# Patient Record
Sex: Female | Born: 1965 | Race: Black or African American | Hispanic: No | Marital: Single | Smoking: Former smoker
Health system: Southern US, Community
[De-identification: ages and names within clinical notes are randomized; demographics above are authoritative.]

## PROBLEM LIST (undated history)

## (undated) DIAGNOSIS — R569 Unspecified convulsions: Secondary | ICD-10-CM

## (undated) DIAGNOSIS — C801 Malignant (primary) neoplasm, unspecified: Secondary | ICD-10-CM

## (undated) DIAGNOSIS — D571 Sickle-cell disease without crisis: Secondary | ICD-10-CM

---

## 1998-12-18 ENCOUNTER — Emergency Department (HOSPITAL_COMMUNITY): Admission: EM | Admit: 1998-12-18 | Discharge: 1998-12-18 | Payer: Self-pay | Admitting: Emergency Medicine

## 2000-08-14 ENCOUNTER — Encounter: Payer: Self-pay | Admitting: Emergency Medicine

## 2000-08-14 ENCOUNTER — Emergency Department (HOSPITAL_COMMUNITY): Admission: EM | Admit: 2000-08-14 | Discharge: 2000-08-14 | Payer: Self-pay | Admitting: *Deleted

## 2001-01-29 ENCOUNTER — Emergency Department (HOSPITAL_COMMUNITY): Admission: EM | Admit: 2001-01-29 | Discharge: 2001-01-30 | Payer: Self-pay | Admitting: Emergency Medicine

## 2001-01-29 ENCOUNTER — Encounter: Payer: Self-pay | Admitting: Emergency Medicine

## 2001-11-13 ENCOUNTER — Encounter: Payer: Self-pay | Admitting: Emergency Medicine

## 2001-11-13 ENCOUNTER — Inpatient Hospital Stay (HOSPITAL_COMMUNITY): Admission: AC | Admit: 2001-11-13 | Discharge: 2001-11-14 | Payer: Self-pay

## 2004-04-04 ENCOUNTER — Emergency Department (HOSPITAL_COMMUNITY): Admission: EM | Admit: 2004-04-04 | Discharge: 2004-04-04 | Payer: Self-pay | Admitting: Emergency Medicine

## 2004-05-12 ENCOUNTER — Emergency Department (HOSPITAL_COMMUNITY): Admission: EM | Admit: 2004-05-12 | Discharge: 2004-05-12 | Payer: Self-pay | Admitting: Emergency Medicine

## 2006-10-18 ENCOUNTER — Inpatient Hospital Stay (HOSPITAL_COMMUNITY): Admission: AD | Admit: 2006-10-18 | Discharge: 2006-10-18 | Payer: Self-pay | Admitting: Obstetrics and Gynecology

## 2006-11-17 ENCOUNTER — Inpatient Hospital Stay (HOSPITAL_COMMUNITY): Admission: AD | Admit: 2006-11-17 | Discharge: 2006-11-17 | Payer: Self-pay | Admitting: Obstetrics and Gynecology

## 2006-12-04 ENCOUNTER — Inpatient Hospital Stay (HOSPITAL_COMMUNITY): Admission: AD | Admit: 2006-12-04 | Discharge: 2006-12-04 | Payer: Self-pay | Admitting: Obstetrics & Gynecology

## 2006-12-04 ENCOUNTER — Ambulatory Visit (HOSPITAL_COMMUNITY): Admission: RE | Admit: 2006-12-04 | Discharge: 2006-12-04 | Payer: Self-pay | Admitting: Family Medicine

## 2008-11-01 ENCOUNTER — Emergency Department (HOSPITAL_COMMUNITY): Admission: EM | Admit: 2008-11-01 | Discharge: 2008-11-01 | Payer: Self-pay | Admitting: Emergency Medicine

## 2009-01-24 ENCOUNTER — Emergency Department (HOSPITAL_COMMUNITY): Admission: EM | Admit: 2009-01-24 | Discharge: 2009-01-24 | Payer: Self-pay | Admitting: Family Medicine

## 2009-03-14 ENCOUNTER — Ambulatory Visit: Payer: Self-pay | Admitting: Internal Medicine

## 2009-03-26 ENCOUNTER — Ambulatory Visit: Payer: Self-pay | Admitting: *Deleted

## 2009-05-03 ENCOUNTER — Ambulatory Visit: Payer: Self-pay | Admitting: Internal Medicine

## 2009-05-07 ENCOUNTER — Ambulatory Visit: Payer: Self-pay | Admitting: Internal Medicine

## 2009-05-14 ENCOUNTER — Encounter: Payer: Self-pay | Admitting: Family Medicine

## 2009-05-14 ENCOUNTER — Ambulatory Visit: Payer: Self-pay | Admitting: Internal Medicine

## 2009-05-14 LAB — CONVERTED CEMR LAB
Chlamydia, DNA Probe: NEGATIVE
GC Probe Amp, Genital: NEGATIVE

## 2009-05-15 ENCOUNTER — Encounter: Admission: RE | Admit: 2009-05-15 | Discharge: 2009-05-15 | Payer: Self-pay | Admitting: Family Medicine

## 2009-05-23 ENCOUNTER — Encounter (HOSPITAL_COMMUNITY): Admission: RE | Admit: 2009-05-23 | Discharge: 2009-08-10 | Payer: Self-pay | Admitting: Family Medicine

## 2009-08-02 ENCOUNTER — Ambulatory Visit: Payer: Self-pay | Admitting: Internal Medicine

## 2009-10-10 ENCOUNTER — Encounter (INDEPENDENT_AMBULATORY_CARE_PROVIDER_SITE_OTHER): Payer: Self-pay | Admitting: Family Medicine

## 2009-10-10 ENCOUNTER — Ambulatory Visit: Payer: Self-pay | Admitting: Internal Medicine

## 2009-10-10 DIAGNOSIS — E079 Disorder of thyroid, unspecified: Secondary | ICD-10-CM | POA: Insufficient documentation

## 2009-10-10 DIAGNOSIS — K5909 Other constipation: Secondary | ICD-10-CM

## 2009-10-10 DIAGNOSIS — N643 Galactorrhea not associated with childbirth: Secondary | ICD-10-CM

## 2009-10-10 LAB — CONVERTED CEMR LAB
Free T4: 1.51 ng/dL (ref 0.80–1.80)
T3, Total: 189 ng/dL (ref 80.0–204.0)

## 2010-08-29 ENCOUNTER — Inpatient Hospital Stay (HOSPITAL_COMMUNITY)
Admission: EM | Admit: 2010-08-29 | Discharge: 2010-09-02 | Payer: Self-pay | Source: Home / Self Care | Admitting: Emergency Medicine

## 2010-10-27 ENCOUNTER — Encounter: Payer: Self-pay | Admitting: Family Medicine

## 2010-10-27 ENCOUNTER — Encounter: Payer: Self-pay | Admitting: Internal Medicine

## 2010-10-27 ENCOUNTER — Encounter: Payer: Self-pay | Admitting: General Surgery

## 2010-10-27 ENCOUNTER — Encounter: Payer: Self-pay | Admitting: *Deleted

## 2010-10-28 ENCOUNTER — Encounter: Payer: Self-pay | Admitting: Family Medicine

## 2010-11-05 NOTE — Miscellaneous (Signed)
Summary: Office Visit ([facility])    Visit Type:  Acute visit   History of Present Illness: Constipation. Pt had been taking Miralax, however has been out for some time. Previously while taking, she has no idea if it was helping her. Not taking anything right now. BMs hard, painful with BRB when wiping and a little in the toilet.  L breast with white discharge and smelly since she had her first child in 1985. LMP finished yesterday. Recently went and had mammogram completed. 7mm cyst in right breast. No discharge with test 05/2009.   Review of Systems General:  Complains of chills and fever; 1st time, 1st episode last week.  None since. subjective. No rigors.Marland Kitchen Resp:  Denies chest discomfort, cough, coughing up blood, shortness of breath, and wheezing. GU:  Denies dysuria.   Physical Exam  General:  Well-developed,well-nourished,in no acute distress; alert,appropriate and cooperative throughout examination Head:  Normocephalic and atraumatic without obvious abnormalities. No apparent alopecia or balding. Neck:  + diffuse goiter, no nodules Lungs:  Normal respiratory effort, chest expands symmetrically. Lungs are clear to auscultation, no crackles or wheezes. Heart:  Normal rate and regular rhythm. S1 and S2 normal without gallop, murmur, click, rub or other extra sounds. Additional Exam:  Breast exam. no discharge on L, no masses on L, sebbaceous cyst between breasts.   Impression & Recommendations:  Problem # 1:  CONSTIPATION, CHRONIC (ICD-564.09) Assessment Unchanged Pt failed to get her refills on Miralax.  Miralax Rx'd.  Problem # 2:  GALACTORRHEA (ICD-611.6) Assessment: Unchanged L breast, non bloody Recent Mammo with only simple cyst in right breast Prolactin normal TSH low  Problem # 3:  THYROID STIMULATING HORMONE, ABNORMAL (ICD-246.9) Repeat TSH, FT4, FT3 Attempt to reorder re-uptake scan

## 2010-12-17 LAB — BASIC METABOLIC PANEL
BUN: 1 mg/dL — ABNORMAL LOW (ref 6–23)
Calcium: 8.2 mg/dL — ABNORMAL LOW (ref 8.4–10.5)
Chloride: 106 mEq/L (ref 96–112)
Creatinine, Ser: 0.53 mg/dL (ref 0.4–1.2)
GFR calc Af Amer: >60 mL/min (ref >60)
GFR calc non Af Amer: >60 mL/min (ref >60)

## 2010-12-17 LAB — COMPREHENSIVE METABOLIC PANEL
ALT: 13 U/L (ref 0–35)
ALT: 15 U/L (ref 0–35)
AST: 18 U/L (ref 0–37)
AST: 21 U/L (ref 0–37)
Albumin: 2.9 g/dL — ABNORMAL LOW (ref 3.5–5.2)
Alkaline Phosphatase: 87 U/L (ref 39–117)
BUN: 15 mg/dL (ref 6–23)
CO2: 23 mEq/L (ref 19–32)
CO2: 25 mEq/L (ref 19–32)
CO2: 26 mEq/L (ref 19–32)
Calcium: 8.7 mg/dL (ref 8.4–10.5)
Chloride: 106 mEq/L (ref 96–112)
Chloride: 106 mEq/L (ref 96–112)
Chloride: 107 mEq/L (ref 96–112)
Creatinine, Ser: 0.53 mg/dL (ref 0.4–1.2)
Creatinine, Ser: 0.76 mg/dL (ref 0.4–1.2)
GFR calc Af Amer: >60 mL/min (ref >60)
GFR calc Af Amer: >60 mL/min (ref >60)
GFR calc non Af Amer: >60 mL/min (ref >60)
GFR calc non Af Amer: >60 mL/min (ref >60)
GFR calc non Af Amer: >60 mL/min (ref >60)
Glucose, Bld: 105 mg/dL — ABNORMAL HIGH (ref 70–99)
Sodium: 135 mEq/L (ref 135–145)
Sodium: 138 mEq/L (ref 135–145)
Total Bilirubin: 0.2 mg/dL — ABNORMAL LOW (ref 0.3–1.2)
Total Bilirubin: 0.3 mg/dL (ref 0.3–1.2)
Total Bilirubin: 0.4 mg/dL (ref 0.3–1.2)

## 2010-12-17 LAB — GC/CHLAMYDIA PROBE AMP, GENITAL
Chlamydia, DNA Probe: NEGATIVE
GC Probe Amp, Genital: NEGATIVE

## 2010-12-17 LAB — COMPREHENSIVE METABOLIC PANEL WITH GFR
ALT: 21 U/L (ref 0–35)
AST: 26 U/L (ref 0–37)
Albumin: 3.3 g/dL — ABNORMAL LOW (ref 3.5–5.2)
Alkaline Phosphatase: 98 U/L (ref 39–117)
Calcium: 8.8 mg/dL (ref 8.4–10.5)
GFR calc Af Amer: >60 mL/min (ref >60)
Potassium: 3.6 meq/L (ref 3.5–5.1)
Sodium: 139 meq/L (ref 135–145)
Total Protein: 7.4 g/dL (ref 6.0–8.3)

## 2010-12-17 LAB — GLUCOSE, CAPILLARY
Glucose-Capillary: 100 mg/dL — ABNORMAL HIGH (ref 70–99)
Glucose-Capillary: 105 mg/dL — ABNORMAL HIGH (ref 70–99)
Glucose-Capillary: 106 mg/dL — ABNORMAL HIGH (ref 70–99)
Glucose-Capillary: 135 mg/dL — ABNORMAL HIGH (ref 70–99)
Glucose-Capillary: 91 mg/dL (ref 70–99)
Glucose-Capillary: 99 mg/dL (ref 70–99)
Glucose-Capillary: 99 mg/dL (ref 70–99)

## 2010-12-17 LAB — RAPID URINE DRUG SCREEN, HOSP PERFORMED
Amphetamines: NOT DETECTED
Benzodiazepines: POSITIVE — AB
Tetrahydrocannabinol: NOT DETECTED

## 2010-12-17 LAB — STOOL CULTURE

## 2010-12-17 LAB — CBC
HCT: 38.4 % (ref 36.0–46.0)
Hemoglobin: 13.4 g/dL (ref 12.0–15.0)
MCH: 30.4 pg (ref 26.0–34.0)
MCH: 30.5 pg (ref 26.0–34.0)
MCHC: 34.9 g/dL (ref 30.0–36.0)
MCV: 87.5 fL (ref 78.0–100.0)
MCV: 88.7 fL (ref 78.0–100.0)
Platelets: 244 10*3/uL (ref 150–400)
Platelets: 264 10*3/uL (ref 150–400)
RBC: 3.91 MIL/uL (ref 3.87–5.11)
RBC: 4.39 MIL/uL (ref 3.87–5.11)
RDW: 13.4 % (ref 11.5–15.5)
WBC: 7.9 K/uL (ref 4.0–10.5)

## 2010-12-17 LAB — CULTURE, BLOOD (ROUTINE X 2): Culture  Setup Time: 201111252248

## 2010-12-17 LAB — T3, FREE: T3, Free: 6.2 pg/mL — ABNORMAL HIGH (ref 2.3–4.2)

## 2010-12-17 LAB — URINALYSIS, ROUTINE W REFLEX MICROSCOPIC
Bilirubin Urine: NEGATIVE
Glucose, UA: NEGATIVE mg/dL
Hgb urine dipstick: NEGATIVE
Ketones, ur: NEGATIVE mg/dL
Nitrite: NEGATIVE
Protein, ur: NEGATIVE mg/dL
Specific Gravity, Urine: 1.024 (ref 1.005–1.030)
Urobilinogen, UA: 0.2 mg/dL (ref 0.0–1.0)
pH: 8 (ref 5.0–8.0)

## 2010-12-17 LAB — PHENYTOIN LEVEL, TOTAL: Phenytoin Lvl: 0.3 ug/mL — ABNORMAL LOW (ref 10.0–20.0)

## 2010-12-17 LAB — T4, FREE: Free T4: 1.94 ng/dL — ABNORMAL HIGH (ref 0.80–1.80)

## 2010-12-17 LAB — LIPASE, BLOOD: Lipase: 33 U/L (ref 11–59)

## 2010-12-17 LAB — DIFFERENTIAL
Basophils Absolute: 0 10*3/uL (ref 0.0–0.1)
Basophils Relative: 0 % (ref 0–1)
Eosinophils Absolute: 0.2 K/uL (ref 0.0–0.7)
Eosinophils Relative: 3 % (ref 0–5)
Lymphocytes Relative: 32 % (ref 12–46)
Lymphs Abs: 2.5 K/uL (ref 0.7–4.0)
Monocytes Absolute: 1 K/uL (ref 0.1–1.0)
Monocytes Relative: 13 % — ABNORMAL HIGH (ref 3–12)
Neutro Abs: 4.1 10*3/uL (ref 1.7–7.7)
Neutrophils Relative %: 53 % (ref 43–77)

## 2010-12-17 LAB — WET PREP, GENITAL
Trich, Wet Prep: NONE SEEN
Yeast Wet Prep HPF POC: NONE SEEN

## 2010-12-17 LAB — FECAL LACTOFERRIN, QUANT: Fecal Lactoferrin: NEGATIVE

## 2010-12-17 LAB — PREGNANCY, URINE: Preg Test, Ur: NEGATIVE

## 2010-12-17 LAB — GIARDIA/CRYPTOSPORIDIUM SCREEN(EIA)
Cryptosporidium Screen (EIA): NEGATIVE
Giardia Screen - EIA: NEGATIVE

## 2010-12-17 LAB — LACTIC ACID, PLASMA: Lactic Acid, Venous: 1.1 mmol/L (ref 0.5–2.2)

## 2010-12-17 LAB — BRAIN NATRIURETIC PEPTIDE: Pro B Natriuretic peptide (BNP): 41 pg/mL (ref 0.0–100.0)

## 2011-01-20 LAB — URINE MICROSCOPIC-ADD ON

## 2011-01-20 LAB — URINALYSIS, ROUTINE W REFLEX MICROSCOPIC
Bilirubin Urine: NEGATIVE
Glucose, UA: NEGATIVE mg/dL
Hgb urine dipstick: NEGATIVE
Ketones, ur: NEGATIVE mg/dL
Protein, ur: NEGATIVE mg/dL
pH: 7 (ref 5.0–8.0)

## 2011-08-29 ENCOUNTER — Emergency Department (HOSPITAL_COMMUNITY): Payer: Self-pay

## 2011-08-29 ENCOUNTER — Encounter: Payer: Self-pay | Admitting: *Deleted

## 2011-08-29 ENCOUNTER — Emergency Department (HOSPITAL_COMMUNITY)
Admission: EM | Admit: 2011-08-29 | Discharge: 2011-08-30 | Disposition: A | Discharge status: Home / Self Care | Payer: Self-pay | Attending: Emergency Medicine | Admitting: Emergency Medicine

## 2011-08-29 DIAGNOSIS — IMO0001 Reserved for inherently not codable concepts without codable children: Secondary | ICD-10-CM | POA: Insufficient documentation

## 2011-08-29 DIAGNOSIS — Z853 Personal history of malignant neoplasm of breast: Secondary | ICD-10-CM | POA: Insufficient documentation

## 2011-08-29 DIAGNOSIS — D571 Sickle-cell disease without crisis: Secondary | ICD-10-CM | POA: Insufficient documentation

## 2011-08-29 DIAGNOSIS — R110 Nausea: Secondary | ICD-10-CM | POA: Insufficient documentation

## 2011-08-29 DIAGNOSIS — J0300 Acute streptococcal tonsillitis, unspecified: Secondary | ICD-10-CM

## 2011-08-29 DIAGNOSIS — Z7982 Long term (current) use of aspirin: Secondary | ICD-10-CM | POA: Insufficient documentation

## 2011-08-29 DIAGNOSIS — J020 Streptococcal pharyngitis: Secondary | ICD-10-CM | POA: Insufficient documentation

## 2011-08-29 DIAGNOSIS — R109 Unspecified abdominal pain: Secondary | ICD-10-CM | POA: Insufficient documentation

## 2011-08-29 HISTORY — DX: Unspecified convulsions: R56.9

## 2011-08-29 HISTORY — DX: Sickle-cell disease without crisis: D57.1

## 2011-08-29 LAB — URINALYSIS, ROUTINE W REFLEX MICROSCOPIC
Glucose, UA: NEGATIVE mg/dL
Protein, ur: 100 mg/dL — AB
Specific Gravity, Urine: 1.015 (ref 1.005–1.030)
Urobilinogen, UA: 1 mg/dL (ref 0.0–1.0)

## 2011-08-29 LAB — COMPREHENSIVE METABOLIC PANEL
ALT: 18 U/L (ref 0–35)
AST: 19 U/L (ref 0–37)
Alkaline Phosphatase: 110 U/L (ref 39–117)
CO2: 22 mEq/L (ref 19–32)
Calcium: 8.2 mg/dL — ABNORMAL LOW (ref 8.4–10.5)
Glucose, Bld: 102 mg/dL — ABNORMAL HIGH (ref 70–99)
Potassium: 3.3 mEq/L — ABNORMAL LOW (ref 3.5–5.1)
Sodium: 135 mEq/L (ref 135–145)
Total Protein: 7.4 g/dL (ref 6.0–8.3)

## 2011-08-29 LAB — CBC
Hemoglobin: 12.8 g/dL (ref 12.0–15.0)
Platelets: 220 10*3/uL (ref 150–400)
RBC: 4.06 MIL/uL (ref 3.87–5.11)
WBC: 15.5 10*3/uL — ABNORMAL HIGH (ref 4.0–10.5)

## 2011-08-29 LAB — URINE MICROSCOPIC-ADD ON

## 2011-08-29 LAB — RAPID URINE DRUG SCREEN, HOSP PERFORMED
Barbiturates: NOT DETECTED
Benzodiazepines: NOT DETECTED
Cocaine: NOT DETECTED
Tetrahydrocannabinol: NOT DETECTED

## 2011-08-29 LAB — DIFFERENTIAL
Eosinophils Absolute: 0.1 10*3/uL (ref 0.0–0.7)
Lymphs Abs: 2.1 10*3/uL (ref 0.7–4.0)
Monocytes Relative: 9 % (ref 3–12)
Neutro Abs: 11.8 10*3/uL — ABNORMAL HIGH (ref 1.7–7.7)
Neutrophils Relative %: 76 % (ref 43–77)

## 2011-08-29 LAB — RAPID STREP SCREEN (MED CTR MEBANE ONLY): Streptococcus, Group A Screen (Direct): POSITIVE — AB

## 2011-08-29 MED ORDER — ONDANSETRON HCL 4 MG/2ML IJ SOLN
INTRAMUSCULAR | Status: AC
  Filled 2011-08-29: qty 2

## 2011-08-29 MED ORDER — HYDROMORPHONE HCL PF 1 MG/ML IJ SOLN
1.0000 mg | Freq: Once | INTRAMUSCULAR | Status: AC
  Administered 2011-08-29: 1 mg via INTRAVENOUS
  Filled 2011-08-29: qty 1

## 2011-08-29 MED ORDER — ONDANSETRON HCL 4 MG/2ML IJ SOLN
4.0000 mg | Freq: Once | INTRAMUSCULAR | Status: AC
Start: 2011-08-29 — End: 2011-08-29
  Administered 2011-08-29: 4 mg via INTRAVENOUS
  Filled 2011-08-29: qty 2

## 2011-08-29 MED ORDER — PENICILLIN G BENZATHINE 1200000 UNIT/2ML IM SUSP
1.2000 10*6.[IU] | Freq: Once | INTRAMUSCULAR | Status: AC
  Administered 2011-08-29: 1.2 10*6.[IU] via INTRAMUSCULAR
  Filled 2011-08-29: qty 2

## 2011-08-29 MED ORDER — ONDANSETRON HCL 4 MG/2ML IJ SOLN
4.0000 mg | Freq: Once | INTRAMUSCULAR | Status: AC
  Administered 2011-08-29: 4 mg via INTRAVENOUS

## 2011-08-29 MED ORDER — MORPHINE SULFATE 4 MG/ML IJ SOLN
4.0000 mg | Freq: Once | INTRAMUSCULAR | Status: AC
  Administered 2011-08-29: 4 mg via INTRAVENOUS
  Filled 2011-08-29: qty 1

## 2011-08-29 MED ORDER — SODIUM CHLORIDE 0.9 % IV BOLUS (SEPSIS)
500.0000 mL | Freq: Once | INTRAVENOUS | Status: AC
  Administered 2011-08-29: 500 mL via INTRAVENOUS

## 2011-08-29 MED ORDER — SODIUM CHLORIDE 0.9 % IV SOLN
INTRAVENOUS | Status: DC
  Administered 2011-08-29 (×2): via INTRAVENOUS

## 2011-08-29 MED ORDER — POTASSIUM CHLORIDE CRYS ER 20 MEQ PO TBCR
40.0000 meq | EXTENDED_RELEASE_TABLET | Freq: Once | ORAL | Status: AC
  Administered 2011-08-29: 40 meq via ORAL
  Filled 2011-08-29: qty 2

## 2011-08-29 NOTE — ED Provider Notes (Signed)
History     CSN: [id number] Arrival date & time: 08/29/2011  5:09 PM   First MD Initiated Contact with Patient 08/29/11 1751      Chief Complaint  Patient presents with  . Nausea    flu like symptoms     (Consider location/radiation/quality/duration/timing/severity/associated sxs/prior treatment) Patient is a 45 y.o. female presenting with fever.  Fever Primary symptoms of the febrile illness include fever, abdominal pain and myalgias. Primary symptoms do not include fatigue, headaches, cough, wheezing, shortness of breath, nausea, vomiting, diarrhea, dysuria, altered mental status or rash. The current episode started 2 days ago.  The fever has been gradually worsening since its onset.  The abdominal pain began today. The abdominal pain radiates to the epigastric region. The severity of the abdominal pain is 4/10. The abdominal pain is relieved by nothing.   Patient has also noted drainage from the left breast today and states that it smells foul. She states that 2 years ago, some nodules were found on her left breast, but she has not followed up on them because she did not have transportation. She has no weight loss, nausea, or vomiting.  Past Medical History  Diagnosis Date  . Breast cancer   . Sickle cell anemia   . Seizures     No past surgical history on file.  No family history on file.  History  Substance Use Topics  . Smoking status: Not on file  . Smokeless tobacco: Not on file  . Alcohol Use:     OB History    Grav Para Term Preterm Abortions TAB SAB Ect Mult Living                  Review of Systems  Constitutional: Positive for fever. Negative for fatigue.  Respiratory: Negative for cough, shortness of breath and wheezing.   Gastrointestinal: Positive for abdominal pain. Negative for nausea, vomiting and diarrhea.  Genitourinary: Negative for dysuria.  Musculoskeletal: Positive for myalgias.  Skin: Negative for rash.  Neurological: Negative for  headaches.  Psychiatric/Behavioral: Negative for altered mental status.  All other systems reviewed and are negative.    Allergies  Acetaminophen and Ibuprofen  Home Medications   Current Outpatient Rx  Name Route Sig Dispense Refill  . ASPIRIN 325 MG PO TBEC Oral Take 325 mg by mouth daily.      Marland Kitchen ONDANSETRON 4 MG PO TBDP Oral Take 4 mg by mouth every 8 (eight) hours as needed.        BP 120/71  Pulse 107  Temp(Src) 100.7 F (38.2 C) (Oral)  Resp 19  SpO2 96%  Physical Exam  Constitutional: She is oriented to person, place, and time. She appears well-developed and well-nourished.  HENT:  Head: Normocephalic and atraumatic.  Right Ear: No tenderness. Tympanic membrane is not injected. No decreased hearing is noted.  Left Ear: No tenderness. Tympanic membrane is not injected. No decreased hearing is noted.  Nose: Nose normal.  Mouth/Throat: Mucous membranes are not pale and not dry. No oral lesions. Normal dentition. No dental abscesses or uvula swelling. Oropharyngeal exudate (Tonsillar) and posterior oropharyngeal erythema (Tonsillar) present. No posterior oropharyngeal edema or tonsillar abscesses.  Eyes: Conjunctivae and EOM are normal. Pupils are equal, round, and reactive to light.  Neck: Normal range of motion. Neck supple.  Cardiovascular: Normal rate and regular rhythm.   Pulmonary/Chest: Effort normal and breath sounds normal.    Abdominal: Soft. She exhibits no mass. There is tenderness (epigastric). There is no rebound  and no guarding.  Musculoskeletal: Normal range of motion. She exhibits no edema and no tenderness.  Neurological: She is alert and oriented to person, place, and time. No cranial nerve deficit. Coordination normal.  Skin: Skin is warm and dry.  Psychiatric: She has a normal mood and affect. Her behavior is normal. Thought content normal.    ED Course  Procedures (including critical care time)  Labs Reviewed  CBC - Abnormal; Notable for the  following:    WBC 15.5 (*)    All other components within normal limits  DIFFERENTIAL - Abnormal; Notable for the following:    Neutro Abs 11.8 (*)    Monocytes Absolute 1.4 (*)    All other components within normal limits  URINALYSIS, ROUTINE W REFLEX MICROSCOPIC - Abnormal; Notable for the following:    Appearance CLOUDY (*)    Hgb urine dipstick SMALL (*)    Protein, ur 100 (*)    Leukocytes, UA TRACE (*)    All other components within normal limits  COMPREHENSIVE METABOLIC PANEL - Abnormal; Notable for the following:    Potassium 3.3 (*)    Glucose, Bld 102 (*)    Calcium 8.2 (*)    Albumin 2.9 (*)    All other components within normal limits  RAPID STREP SCREEN - Abnormal; Notable for the following:    Streptococcus, Group A Screen (Direct) POSITIVE (*)    All other components within normal limits  ETHANOL  URINE RAPID DRUG SCREEN (HOSP PERFORMED)  LIPASE, BLOOD  URINE MICROSCOPIC-ADD ON  CULTURE, BLOOD (ROUTINE X 2)  CULTURE, BLOOD (ROUTINE X 2)   Dg Chest 2 View  08/29/2011  *RADIOLOGY REPORT*  Clinical Data: Chest pain, productive cough, fever  CHEST - 2 VIEW  Comparison: 11/17/2006  Findings: Upper-normal size of cardiac silhouette. Mediastinal contours and pulmonary vascularity normal. Minimal chronic peribronchial thickening. No pulmonary infiltrate, pleural effusion or pneumothorax. Probable left nipple shadow. Bones unremarkable.  IMPRESSION: Minimal bronchitic changes without infiltrate. Probable left nipple shadow.  Original Report Authenticated By: Lollie Marrow, M.D.     1. Streptococcal tonsillitis       MDM  Tonsillitis, treated ED. No clinical suspicion for large left breast mass or infection. Patient is stable for discharge with outpatient management.        Flint Melter, MD 08/30/11 [phone]

## 2011-08-29 NOTE — ED Notes (Signed)
WUJ:[id number]<JY> Expected date:08/29/11<BR> Expected time: 4:36 PM<BR> Means of arrival:Ambulance<BR> Comments:<BR> gc m59 female. Breast ca. Has not had treatment in one years and seeks treatment now. 10 min eta

## 2011-08-29 NOTE — ED Notes (Signed)
Pt presents with a c/c of flu like symptoms and chest/breast pain since this past Tuesday.  Pt has been vomiting since yesterday, pt has breast cancer.  Pt's last chemo treatment was in 2009, st's she doesn't have transportation.  Pt's fever as 102.9.  Pt has had diarrhea since this past tuesday

## 2011-08-30 MED ORDER — HYDROCODONE-ACETAMINOPHEN 5-325 MG PO TABS
1.0000 | ORAL_TABLET | ORAL | Status: DC | PRN
  Administered 2011-08-30: 1 via ORAL
  Filled 2011-08-30: qty 1

## 2011-08-30 MED ORDER — HYDROCODONE-ACETAMINOPHEN 5-325 MG PO TABS: 2.0000 | ORAL_TABLET | ORAL | Status: AC | PRN

## 2011-09-05 LAB — CULTURE, BLOOD (ROUTINE X 2)
Culture  Setup Time: 201211240054
Culture: NO GROWTH

## 2011-10-23 IMAGING — US US PELVIS COMPLETE MODIFY
1 series · 14 of 25 positions shown · non-contrast
Comparison: CT dated 08/30/2010.

CLINICAL DATA: Abdominal pain.



[Series 1: us pelvis complete modify · 0.30mm/px · 14 of 37 slices shown]
[im 1/37]
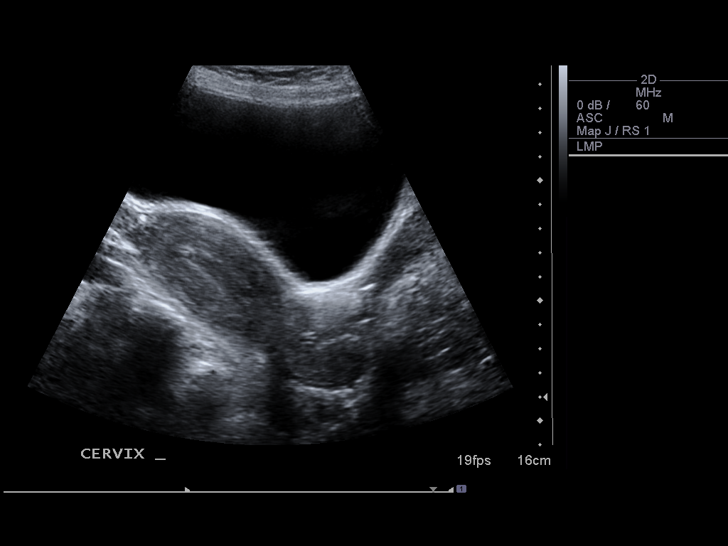
[im 4/37]
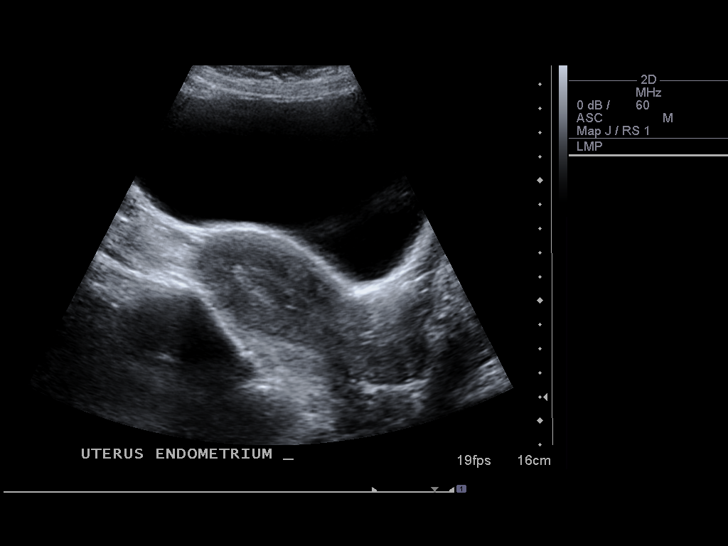
[im 7/37]
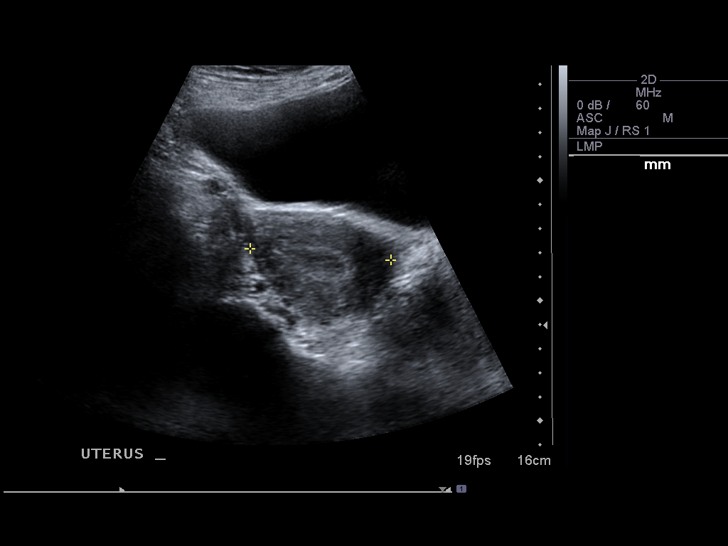
[im 10/37]
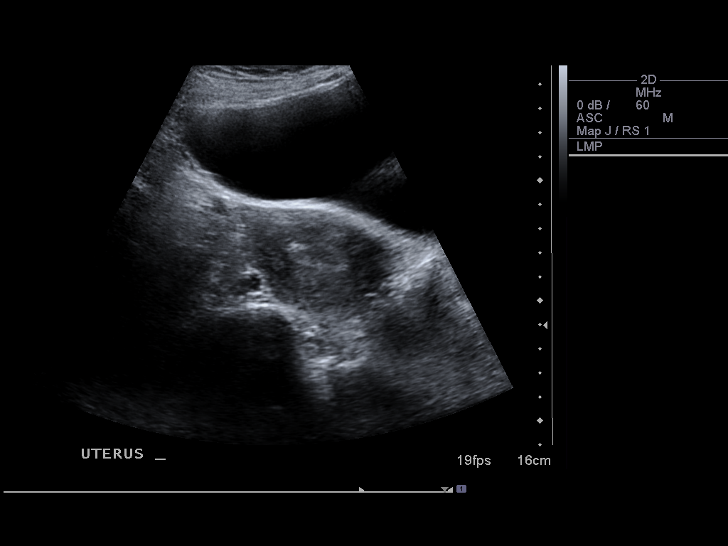
[im 13/37]
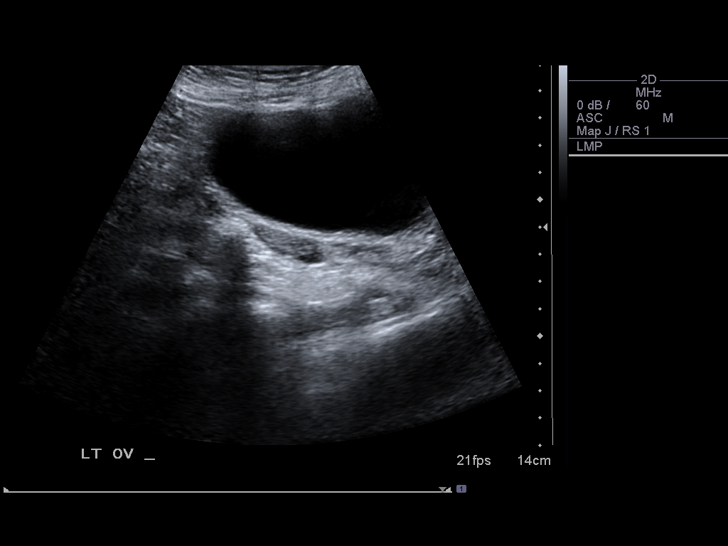
[im 14/37]
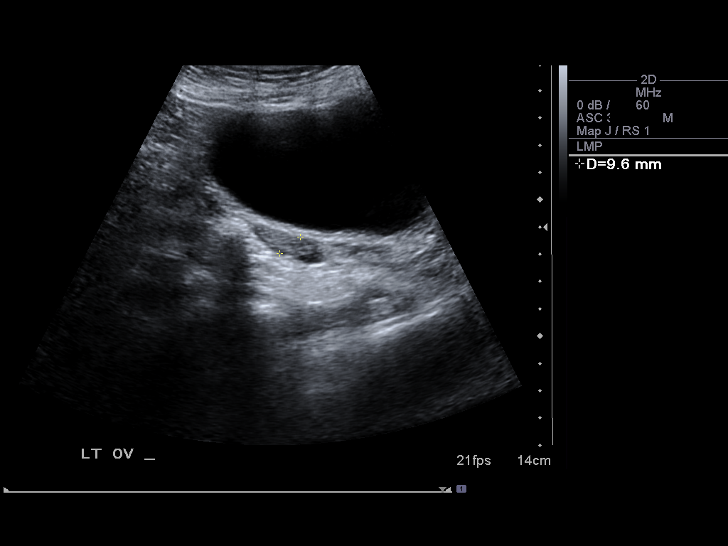
[im 17/37]
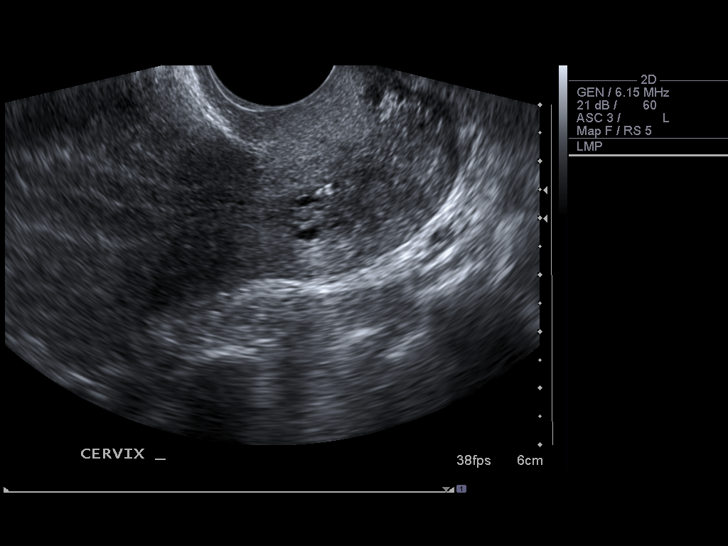
[im 20/37]
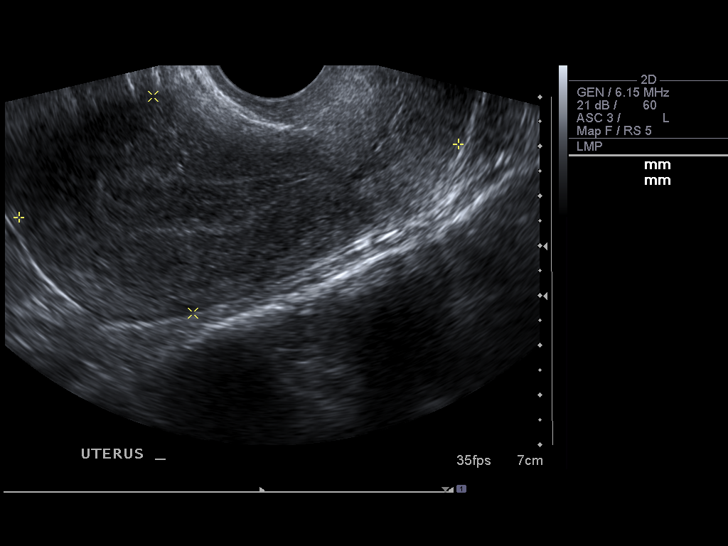
[im 23/37]
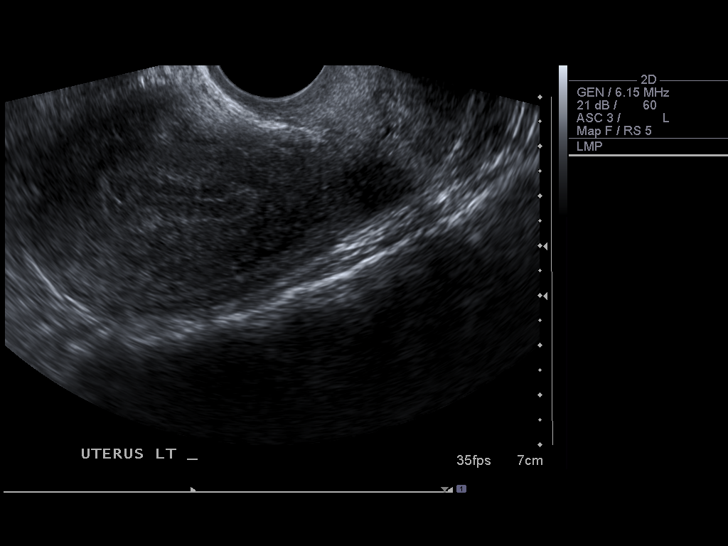
[im 25/37]
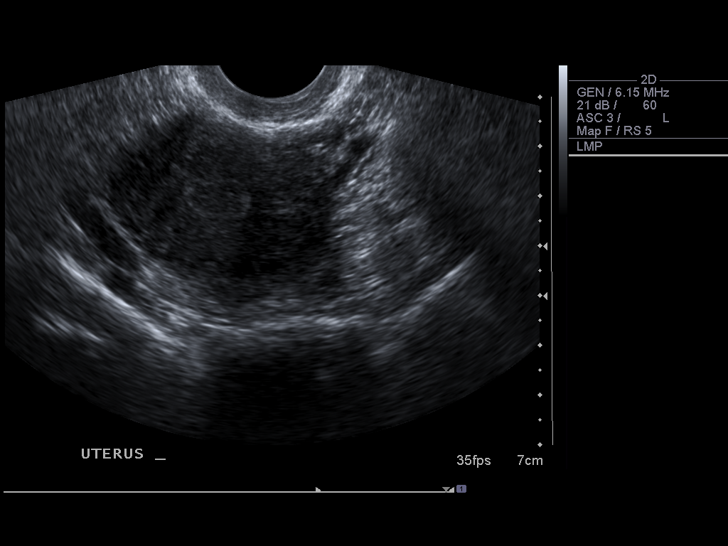
[im 28/37]
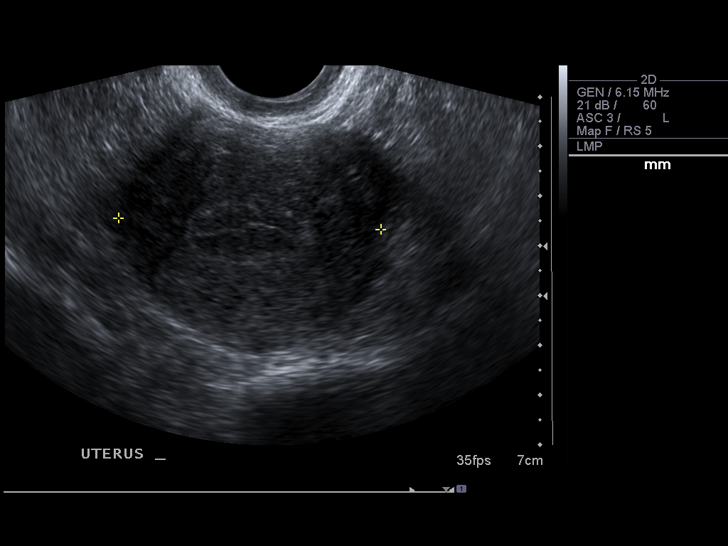
[im 31/37]
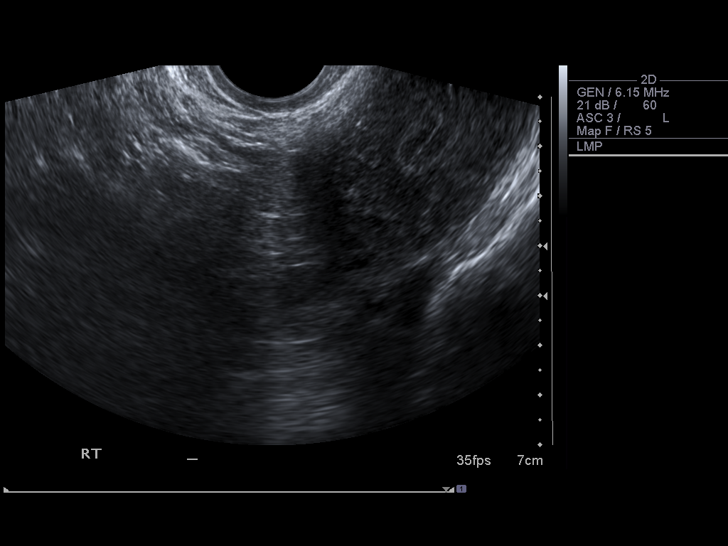
[im 34/37]
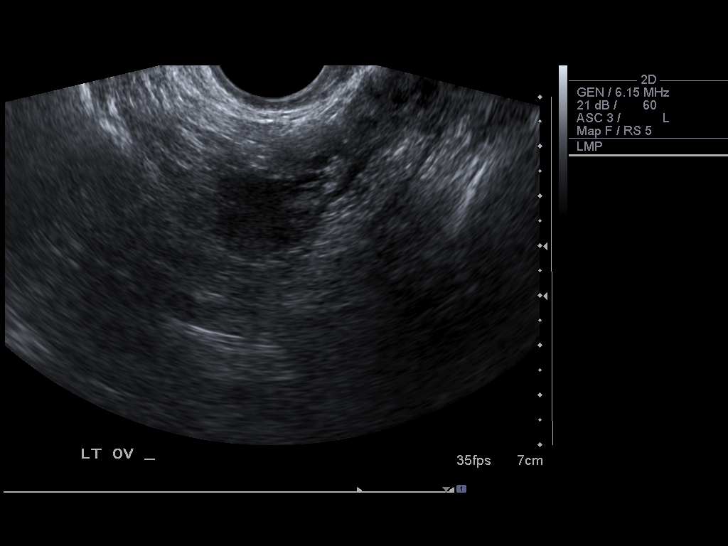
[im 37/37]
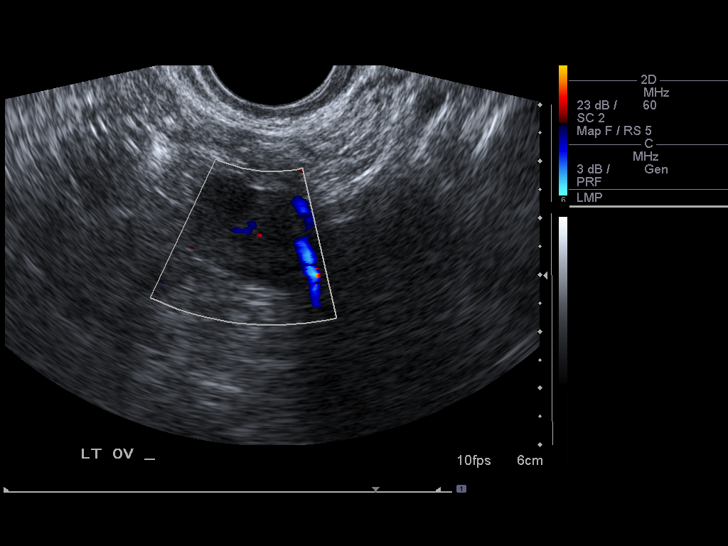

[14 of 25 positions shown; findings below may reference images not displayed]

FINDINGS: Uterus normal measuring 10.8 x 4.3 x 5.9 cm.  No mass is seen.
Nabothian cysts are noted in the cervix.

Endometrium trilaminar appearance measuring 1.3 cm.  No focal mass
or fluid is seen in the endometrial canal.

Right Ovary not visualized.  No adnexal masses seen.

Left Ovary 3.0 x 1.6 x 1.5 cm.

Other Findings:  No free fluid.
IMPRESSION: 1.  No acute findings in the pelvis.
2.  Thickening of the endometrium.  Correlation should be made with
a menopausal state.  If the patient is still having periods, this
is within normal limits.  If not, gynecologic consultation is
recommended.

## 2012-05-07 ENCOUNTER — Emergency Department (HOSPITAL_COMMUNITY)
Admission: EM | Admit: 2012-05-07 | Discharge: 2012-05-07 | Disposition: A | Discharge status: Home / Self Care | Payer: Self-pay | Attending: Emergency Medicine | Admitting: Emergency Medicine

## 2012-05-07 ENCOUNTER — Emergency Department (HOSPITAL_COMMUNITY): Payer: Self-pay

## 2012-05-07 ENCOUNTER — Encounter (HOSPITAL_COMMUNITY): Payer: Self-pay | Admitting: Emergency Medicine

## 2012-05-07 DIAGNOSIS — Z8719 Personal history of other diseases of the digestive system: Secondary | ICD-10-CM | POA: Insufficient documentation

## 2012-05-07 DIAGNOSIS — K625 Hemorrhage of anus and rectum: Secondary | ICD-10-CM | POA: Insufficient documentation

## 2012-05-07 DIAGNOSIS — N6452 Nipple discharge: Secondary | ICD-10-CM

## 2012-05-07 DIAGNOSIS — Z853 Personal history of malignant neoplasm of breast: Secondary | ICD-10-CM | POA: Insufficient documentation

## 2012-05-07 DIAGNOSIS — N644 Mastodynia: Secondary | ICD-10-CM | POA: Insufficient documentation

## 2012-05-07 DIAGNOSIS — R109 Unspecified abdominal pain: Secondary | ICD-10-CM

## 2012-05-07 LAB — COMPREHENSIVE METABOLIC PANEL
BUN: 8 mg/dL (ref 6–23)
CO2: 23 mEq/L (ref 19–32)
Calcium: 9.2 mg/dL (ref 8.4–10.5)
Creatinine, Ser: 0.38 mg/dL — ABNORMAL LOW (ref 0.50–1.10)
GFR calc Af Amer: >90 mL/min (ref >90)
GFR calc non Af Amer: >90 mL/min (ref >90)
Glucose, Bld: 117 mg/dL — ABNORMAL HIGH (ref 70–99)
Sodium: 138 mEq/L (ref 135–145)
Total Protein: 7.8 g/dL (ref 6.0–8.3)

## 2012-05-07 LAB — CBC WITH DIFFERENTIAL/PLATELET
Eosinophils Absolute: 0.1 10*3/uL (ref 0.0–0.7)
Eosinophils Relative: 2 % (ref 0–5)
HCT: 37.1 % (ref 36.0–46.0)
Lymphocytes Relative: 36 % (ref 12–46)
Lymphs Abs: 1.9 10*3/uL (ref 0.7–4.0)
MCH: 29.5 pg (ref 26.0–34.0)
MCV: 84.9 fL (ref 78.0–100.0)
Monocytes Absolute: 0.6 10*3/uL (ref 0.1–1.0)
Monocytes Relative: 11 % (ref 3–12)
Platelets: 274 10*3/uL (ref 150–400)
RBC: 4.37 MIL/uL (ref 3.87–5.11)

## 2012-05-07 LAB — POCT I-STAT, CHEM 8
BUN: 7 mg/dL (ref 6–23)
Chloride: 109 mEq/L (ref 96–112)
Creatinine, Ser: 0.5 mg/dL (ref 0.50–1.10)
Sodium: 140 mEq/L (ref 135–145)
TCO2: 21 mmol/L (ref 0–100)

## 2012-05-07 MED ORDER — HYDROCODONE-ACETAMINOPHEN 5-325 MG PO TABS: 2.0000 | ORAL_TABLET | ORAL | Status: DC | PRN

## 2012-05-07 MED ORDER — IOHEXOL 300 MG/ML  SOLN
80.0000 mL | Freq: Once | INTRAMUSCULAR | Status: AC | PRN
  Administered 2012-05-07: 80 mL via INTRAVENOUS

## 2012-05-07 MED ORDER — HYDROMORPHONE HCL PF 1 MG/ML IJ SOLN
1.0000 mg | Freq: Once | INTRAMUSCULAR | Status: AC
  Administered 2012-05-07: 1 mg via INTRAVENOUS
  Filled 2012-05-07: qty 1

## 2012-05-07 MED ORDER — IOHEXOL 300 MG/ML  SOLN
20.0000 mL | INTRAMUSCULAR | Status: AC
  Administered 2012-05-07: 20 mL via ORAL

## 2012-05-07 NOTE — ED Notes (Signed)
Pt reports leaking bright red blood x 1 1/2 weeks from her "butt," pt reports she is "leaking" from her but and it stinks. Pt reports hx of the same, pt also having abd discomfort. Pt reports "knots and leaking from her (L) breast" starting in 1982. Pt presents w/abcsess areas to sternum area of chest. No abscess or drainage noted to (L) breast. Pt reports (L) breast is tender to touch.

## 2012-05-07 NOTE — ED Provider Notes (Signed)
5:45 PM Assumed care of patient from Grant Fontana, PA-C.  Patient presents today with a chief complaint of abdominal pain, rectal bleeding, and breast discharge.  Patient with history of Diverticulosis.  The plan is for patient to have a CT scan of her ab/pelvis and discharge if negative.  She will be referred to the breast center for follow up of the breast discharge.  CT negative for acute findings.  Reassessed patient.  Patient reports that she continues to have abdominal pain.  Patient alert and orientated x 3, Heart RRR, Lungs CTAB, Abdomen soft with diffuse tenderness that is worse in the LLQ.  Will order pain medication and reassess.    7:31 PM Reassessed patient.  She reports that her pain has improved.  Will discharge patient home.  Pascal Lux Viking, PA-C 05/08/12 [phone]

## 2012-05-07 NOTE — ED Notes (Signed)
MD at bedside. Santina Evans EDPA

## 2012-05-07 NOTE — ED Provider Notes (Signed)
Medical screening examination/treatment/procedure(s) were performed by non-physician practitioner and as supervising physician I was immediately available for consultation/collaboration.  MDM Marie Parsons is a 46 y.o. female with chronic intermittent L breast discharge and diverticulosis here with L breast discharge and BRBPR x 2 days. Had mammogram in 2010 and a biopsy that showed a 7mm cyst on R breast and she also has diverticulosis on CT 2011. Will check cbc, cmp. Given that patient refused rectal exam, will do CT ab/pel to r/o active bleeding if H/H normal. If H/H low will transfuse and pursue GI workup. Patient can get outpatient mammogram and workup for breast discharge.   Richardean Canal, MD 05/08/12 [phone]

## 2012-05-07 NOTE — ED Provider Notes (Signed)
History     CSN: [id number]  Arrival date & time 05/07/12  1200   First MD Initiated Contact with Patient 05/07/12 1216      Chief Complaint  Patient presents with  . Rectal Bleeding  . Breast Discharge    (Consider location/radiation/quality/duration/timing/severity/associated sxs/prior treatment) HPI History from patient. 46 year old female presents with rectal bleeding and discharge from the left breast. She reports that the rectal bleeding started 2-3 days ago. It is bright red blood with bowel movements. She denies noticing any masses or tenderness to the rectum. She has occasionally also noticed bright red blood when urinating, which seems to be coming from the rectum. She denies any abdominal pain, nausea, vomiting, changes in bowel movements. No fever, chills, changes in appetite.  Patient states that she has had intermittent foul-smelling discharge from the left breast for approximately the last 30 years. She reports that she had an imaging study performed last year which showed a possible mass.  Per chart review, pt had CT abd/pelvis 2 years ago which showed diverticulosis. She had a breast imaging study last year which showed a cyst.  Past Medical History  Diagnosis Date  . Breast cancer   . Sickle cell anemia   . Seizures     History reviewed. No pertinent past surgical history.  History reviewed. No pertinent family history.  History  Substance Use Topics  . Smoking status: Not on file  . Smokeless tobacco: Not on file  . Alcohol Use:     OB History    Grav Para Term Preterm Abortions TAB SAB Ect Mult Living                  Review of Systems  Constitutional: Negative for fever, chills, activity change and appetite change.  Respiratory: Negative for chest tightness and shortness of breath.   Cardiovascular: Negative for chest pain and palpitations.  Gastrointestinal: Positive for anal bleeding. Negative for nausea, vomiting, abdominal pain, diarrhea,  constipation and rectal pain.  Genitourinary: Negative for dysuria, urgency, decreased urine volume and vaginal bleeding.  Skin: Negative for rash.  Neurological: Negative for dizziness and light-headedness.  All other systems reviewed and are negative.    Allergies  Acetaminophen; Aspirin; and Ibuprofen  Home Medications  No current outpatient prescriptions on file.  BP 140/71  Pulse 114  Temp 98.1 F (36.7 C) (Oral)  Resp 18  SpO2 99%  Physical Exam  Nursing note and vitals reviewed. Constitutional: She appears well-developed and well-nourished. No distress.  HENT:  Head: Normocephalic and atraumatic.  Mouth/Throat: Oropharynx is clear and moist. No oropharyngeal exudate.  Eyes:       Normal appearance, conjunctivae non pale appearing  Neck: Normal range of motion.  Cardiovascular: Normal rate, regular rhythm and normal heart sounds.   Pulmonary/Chest: Effort normal and breath sounds normal. She exhibits no tenderness.       RN chaperone present during exam. Small sebaceous cysts located between breasts, no active drainage. Tender to palpation of the left breast diffusely with no palpable masses. No asymmetry as compared with R, no drainage, no axillary lymphadenopathy.  Abdominal: Soft. Bowel sounds are normal.       Mild diffuse tenderness without rebound or guard  Genitourinary:       Pt was unable to cooperate with digital rectal exam. Gross blood seen on glove from perineum. No evidence of external hemorrhoids.  Musculoskeletal: Normal range of motion.  Neurological: She is alert.  Skin: Skin is warm and dry. She  is not diaphoretic.  Psychiatric: She has a normal mood and affect.    ED Course  Procedures (including critical care time)  Labs Reviewed  POCT I-STAT, CHEM 8 - Abnormal; Notable for the following:    Glucose, Bld 113 (*)     All other components within normal limits  COMPREHENSIVE METABOLIC PANEL - Abnormal; Notable for the following:    Glucose,  Bld 117 (*)     Creatinine, Ser 0.38 (*)     Alkaline Phosphatase 127 (*)     All other components within normal limits  CBC WITH DIFFERENTIAL  URINALYSIS, ROUTINE W REFLEX MICROSCOPIC   No results found.   No diagnosis found.    MDM  Pt with 2 complaints. She has pain to the left breast which has been a longstanding problem. No drainage noted on my exam. Imaging last yr showed 7mm cyst. She will need outpt referral to Breast Center for further eval.  She also c/o rectal bleeding for past several days with BRB. Unable to cooperate with DRE. Hx diverticulosis previously. Stable h/h today. Pt pending CT abd/pelvis to r/o intraabd pathology. Signed out to [employer], PA-C pending this.        Grant Fontana, PA-C 05/07/12 1541  Grant Fontana, PA-C 05/07/12 1601

## 2012-05-07 NOTE — ED Notes (Signed)
Pt sts rectal bleeding x 1 week with BRB and sts leaking foul smelling discharge from left breast

## 2012-05-08 NOTE — ED Provider Notes (Signed)
Medical screening examination/treatment/procedure(s) were performed by non-physician practitioner and as supervising physician I was immediately available for consultation/collaboration.  Ethelda Chick, MD 05/08/12 [phone]

## 2012-05-09 ENCOUNTER — Encounter (HOSPITAL_COMMUNITY): Payer: Self-pay | Admitting: Emergency Medicine

## 2012-05-09 ENCOUNTER — Emergency Department (HOSPITAL_COMMUNITY)
Admission: EM | Admit: 2012-05-09 | Discharge: 2012-05-09 | Disposition: A | Discharge status: Home / Self Care | Payer: Self-pay | Attending: Emergency Medicine | Admitting: Emergency Medicine

## 2012-05-09 DIAGNOSIS — K625 Hemorrhage of anus and rectum: Secondary | ICD-10-CM | POA: Insufficient documentation

## 2012-05-09 DIAGNOSIS — D571 Sickle-cell disease without crisis: Secondary | ICD-10-CM | POA: Insufficient documentation

## 2012-05-09 DIAGNOSIS — Z853 Personal history of malignant neoplasm of breast: Secondary | ICD-10-CM | POA: Insufficient documentation

## 2012-05-09 LAB — COMPREHENSIVE METABOLIC PANEL
ALT: 19 U/L (ref 0–35)
AST: 28 U/L (ref 0–37)
Albumin: 3.1 g/dL — ABNORMAL LOW (ref 3.5–5.2)
CO2: 24 mEq/L (ref 19–32)
Calcium: 8.9 mg/dL (ref 8.4–10.5)
Chloride: 103 mEq/L (ref 96–112)
Creatinine, Ser: 0.5 mg/dL (ref 0.50–1.10)
GFR calc non Af Amer: >90 mL/min (ref >90)
Sodium: 136 mEq/L (ref 135–145)
Total Bilirubin: 0.2 mg/dL — ABNORMAL LOW (ref 0.3–1.2)

## 2012-05-09 LAB — TYPE AND SCREEN: Antibody Screen: NEGATIVE

## 2012-05-09 LAB — CBC
MCV: 85.1 fL (ref 78.0–100.0)
Platelets: 255 10*3/uL (ref 150–400)
RBC: 4.24 MIL/uL (ref 3.87–5.11)
RDW: 15.4 % (ref 11.5–15.5)
WBC: 6.3 10*3/uL (ref 4.0–10.5)

## 2012-05-09 LAB — PROTIME-INR: Prothrombin Time: 13.4 seconds (ref 11.6–15.2)

## 2012-05-09 MED ORDER — SENNOSIDES-DOCUSATE SODIUM 8.6-50 MG PO TABS: 1.0000 | ORAL_TABLET | Freq: Every day | ORAL | Status: DC

## 2012-05-09 MED ORDER — ONDANSETRON HCL 4 MG/2ML IJ SOLN
4.0000 mg | Freq: Once | INTRAMUSCULAR | Status: AC
  Administered 2012-05-09: 4 mg via INTRAVENOUS
  Filled 2012-05-09: qty 2

## 2012-05-09 MED ORDER — MORPHINE SULFATE 4 MG/ML IJ SOLN
4.0000 mg | Freq: Once | INTRAMUSCULAR | Status: AC
  Administered 2012-05-09: 4 mg via INTRAVENOUS
  Filled 2012-05-09: qty 1

## 2012-05-09 MED ORDER — LIDOCAINE 5 % EX OINT: TOPICAL_OINTMENT | CUTANEOUS | Status: DC

## 2012-05-09 MED ORDER — TRAMADOL HCL 50 MG PO TABS: 50.0000 mg | ORAL_TABLET | Freq: Four times a day (QID) | ORAL | Status: AC | PRN

## 2012-05-09 NOTE — ED Notes (Signed)
Pt arrived by EMS with complaints of rectal bleeding. Pt states this has been on ongoing issue. She was seen here yesterday for same. hasnt had time to get prescriptions filled. States that bleeding is worse now. And is having rectal and left sided abdominal pain. 20G IV left AC. Hx of seizures and sickle cell

## 2012-05-09 NOTE — ED Notes (Signed)
Pt given d/c instructions along with 3 prescriptions to get filled.

## 2012-05-09 NOTE — ED Provider Notes (Signed)
History     CSN: [id number]  Arrival date & time 05/09/12  7829   First MD Initiated Contact with Patient 05/09/12 2006      Chief Complaint  Patient presents with  . Rectal Bleeding    (Consider location/radiation/quality/duration/timing/severity/associated sxs/prior treatment) HPI  46 y/o female with PMH significant for sickle cell train and epileptic seizures c/o rectal bleeding  3 months worsening over the course of the last week. Pt reports pain with defecation and blood streaked stool. Patient was seen for this 48 hours ago, states she was never given prescriptions. She also complains of lower left quadrant abdominal pain and discharge from the left breast. Patient denies fever, diarrhea, nausea or vomiting, palpitations, shortness of breath or chest pain  Past Medical History  Diagnosis Date  . Breast cancer   . Sickle cell anemia   . Seizures     History reviewed. No pertinent past surgical history.  No family history on file.  History  Substance Use Topics  . Smoking status: Not on file  . Smokeless tobacco: Not on file  . Alcohol Use:     OB History    Grav Para Term Preterm Abortions TAB SAB Ect Mult Living                  Review of Systems  Allergies  Acetaminophen; Aspirin; and Ibuprofen  Home Medications   Current Outpatient Rx  Name Route Sig Dispense Refill  . HYDROCODONE-ACETAMINOPHEN 5-325 MG PO TABS Oral Take 2 tablets by mouth every 4 (four) hours as needed for pain. 15 tablet 0    BP 113/50  Pulse 102  Temp 98.8 F (37.1 C)  Resp 25  SpO2 98%  LMP 05/07/2012  Physical Exam  Vitals reviewed. Constitutional: She is oriented to person, place, and time. She appears well-developed and well-nourished. No distress.  HENT:  Head: Normocephalic and atraumatic.  Eyes: Conjunctivae and EOM are normal. Pupils are equal, round, and reactive to light.  Neck: Normal range of motion.  Cardiovascular: Normal rate and regular rhythm.     Pulmonary/Chest: Effort normal and breath sounds normal.  Abdominal: Soft. Bowel sounds are normal. She exhibits no distension and no mass. There is tenderness. There is no rebound and no guarding.       LLQ tenderness, no peritoneal signs  Musculoskeletal: Normal range of motion.  Neurological: She is alert and oriented to person, place, and time.  Skin:       Several firm cysts ~1 cm, no erythema, no induration, warmth or discharge  Psychiatric: She has a normal mood and affect.    ED Course  Procedures (including critical care time)  Labs Reviewed  COMPREHENSIVE METABOLIC PANEL - Abnormal; Notable for the following:    Glucose, Bld 106 (*)     Albumin 3.1 (*)     Alkaline Phosphatase 119 (*)     Total Bilirubin 0.2 (*)     All other components within normal limits  PROTIME-INR  CBC  TYPE AND SCREEN  ABO/RH   No results found.   1. Rectal bleed       MDM  46 year old female with complaints of bright red blood per rectum. Patient was seen here for similar complaint 48 hours ago. Has not filled her prescriptions. As per chart review she has diverticulosis diagnosed 2 years ago. CT abdomen pelvis from 48 hours ago shows no acute abnormality. H&H is stable at 12.5 over 36.1.  Patient also reports breast drainage that  she has had for 30 years. No active drainage on physical exam.   Pt refuses digital rectal exam. Pt counseled on outpatient follow up options and given the resource guide with return precautions.         Wynetta Emery, PA-C 05/09/12 2336

## 2012-05-09 NOTE — ED Notes (Signed)
Pt. D/c papers yesterday did not include the prescription papers so she was unable to get it filled. MD who saw her yesterday was encouraging her to be referred to a specialist due to nipple dc. Pt was wondering if she could somehow get a referral . Pt continues to c/o severe left sided abdominal pain.

## 2012-05-12 NOTE — ED Provider Notes (Signed)
Medical screening examination/treatment/procedure(s) were performed by non-physician practitioner and as supervising physician I was immediately available for consultation/collaboration.   Loren Racer, MD 05/12/12 [phone]

## 2012-11-26 ENCOUNTER — Other Ambulatory Visit (HOSPITAL_COMMUNITY): Payer: Self-pay | Admitting: Family Medicine

## 2012-11-26 DIAGNOSIS — R000 Tachycardia, unspecified: Secondary | ICD-10-CM

## 2012-11-29 ENCOUNTER — Ambulatory Visit (HOSPITAL_COMMUNITY): Admission: RE | Admit: 2012-11-29 | Payer: Self-pay | Source: Ambulatory Visit

## 2012-11-30 ENCOUNTER — Emergency Department (HOSPITAL_COMMUNITY)
Admission: EM | Admit: 2012-11-30 | Discharge: 2012-11-30 | Disposition: A | Discharge status: Home / Self Care | Payer: Self-pay | Attending: Emergency Medicine | Admitting: Emergency Medicine

## 2012-11-30 ENCOUNTER — Encounter (HOSPITAL_COMMUNITY): Payer: Self-pay | Admitting: Nurse Practitioner

## 2012-11-30 DIAGNOSIS — N6489 Other specified disorders of breast: Secondary | ICD-10-CM | POA: Insufficient documentation

## 2012-11-30 DIAGNOSIS — Z79899 Other long term (current) drug therapy: Secondary | ICD-10-CM | POA: Insufficient documentation

## 2012-11-30 DIAGNOSIS — G40909 Epilepsy, unspecified, not intractable, without status epilepticus: Secondary | ICD-10-CM | POA: Insufficient documentation

## 2012-11-30 DIAGNOSIS — Z862 Personal history of diseases of the blood and blood-forming organs and certain disorders involving the immune mechanism: Secondary | ICD-10-CM | POA: Insufficient documentation

## 2012-11-30 DIAGNOSIS — R0609 Other forms of dyspnea: Secondary | ICD-10-CM | POA: Insufficient documentation

## 2012-11-30 DIAGNOSIS — Z8589 Personal history of malignant neoplasm of other organs and systems: Secondary | ICD-10-CM | POA: Insufficient documentation

## 2012-11-30 DIAGNOSIS — R0989 Other specified symptoms and signs involving the circulatory and respiratory systems: Secondary | ICD-10-CM | POA: Insufficient documentation

## 2012-11-30 DIAGNOSIS — F17200 Nicotine dependence, unspecified, uncomplicated: Secondary | ICD-10-CM | POA: Insufficient documentation

## 2012-11-30 HISTORY — DX: Malignant (primary) neoplasm, unspecified: C80.1

## 2012-11-30 NOTE — ED Provider Notes (Signed)
Medical screening examination/treatment/procedure(s) were performed by non-physician practitioner and as supervising physician I was immediately available for consultation/collaboration.  Aniesa Boback K Linker, MD 11/30/12 1615 

## 2012-11-30 NOTE — ED Provider Notes (Signed)
History    This chart was scribed for non physician practitioner working for Ethelda Chick, MD by Gerlean Ren, ED Scribe. This patient was seen in room TR05C/TR05C and the patient's care was started at 3:12 PM    CSN: [id number]  Arrival date & time 11/30/12  1430   First MD Initiated Contact with Patient 11/30/12 1459      Chief Complaint  Patient presents with  . Follow-up     The history is provided by the patient. No language interpreter was used.  Marie Parsons is a 47 y.o. female who presents to the Emergency Department to have her thyroid checked sent from [facility] where she reported after having some throat pain.  Pt also wants to have a painful knot in her left breast examined that was first noticed in 2009 and has been growing in size since.  The knots haveoccasionally drained yellow odorous fluid since 2009 and are tender to touch.  No nipple discharge, no blood in discharge, no itching around the knot.  Pt denies fever, chills.  Pt reports occasional dyspnea not different from baseline that may be related to asthma and occasional tobacco use.  Pt has h/o sickle cell anemia.  No OCM used for pain.      Past Medical History  Diagnosis Date  . Sickle cell anemia   . Seizures   . Cancer     History reviewed. No pertinent past surgical history.  History reviewed. No pertinent family history.  History  Substance Use Topics  . Smoking status: Current Some Day Smoker  . Smokeless tobacco: Not on file  . Alcohol Use: No    No OB history provided.   Review of Systems  HENT: Negative for sore throat.   Respiratory: Negative for shortness of breath.   Skin:       Knot/cyst left breast  All other systems reviewed and are negative.    Allergies  Acetaminophen; Aspirin; Fish allergy; Ibuprofen; and Peanut-containing drug products  Home Medications   Current Outpatient Rx  Name  Route  Sig  Dispense  Refill  . atenolol (TENORMIN) 25 MG tablet   Oral   Take 25 mg  by mouth 2 (two) times daily. For 30 days         . carbamazepine (TEGRETOL) 200 MG tablet   Oral   Take 400 mg by mouth 2 (two) times daily.          Marland Kitchen ibuprofen (ADVIL,MOTRIN) 200 MG tablet   Oral   Take 200 mg by mouth daily as needed for pain.         Marland Kitchen senna-docusate (SENOKOT-S) 8.6-50 MG per tablet   Oral   Take 1 tablet by mouth daily as needed for constipation.           BP 115/64  Pulse 82  Temp(Src) 98.1 F (36.7 C) (Oral)  Resp 18  SpO2 99%  Physical Exam  Nursing note and vitals reviewed. Constitutional: She is oriented to person, place, and time. She appears well-developed and well-nourished. No distress.  HENT:  Head: Normocephalic and atraumatic.  Mouth/Throat: Oropharynx is clear and moist. No oropharyngeal exudate.  Eyes: EOM are normal.  Neck: Neck supple. No JVD present. No tracheal deviation present. No thyromegaly present.  Cardiovascular: Normal rate, regular rhythm and normal heart sounds.   Pulmonary/Chest: Effort normal and breath sounds normal. No stridor. No respiratory distress. She has no wheezes. She has no rales.  Musculoskeletal: Normal range of  motion.  Left breast appears normal, right breast appears normal, between breasts at sternal region there appears to be several 3-75mm cystic lesions mildly tender to palpation without any discharge or redness, no obvious evidence of infection. No nipple discharge, no peau d'orange  Lymphadenopathy:    She has no cervical adenopathy.  Neurological: She is alert and oriented to person, place, and time.  Skin: Skin is warm and dry.  Psychiatric: She has a normal mood and affect. Her behavior is normal.    ED Course  Procedures (including critical care time) DIAGNOSTIC STUDIES: Oxygen Saturation is 99% on room air, normal by my interpretation.    COORDINATION OF CARE: 3:20 PM- Informed pt that we cannot check thyroid levels here.  No evidence concerning for thyrotoxicosis or other life  threatening condition.  VSS.  Informed pt to follow-up with PCP for thyroid level check.  Further informed pt that knots in breast are not abscesses and do not appear cancerous.  Advised follow-up with PCP to observe progression of knots.  Will give referral to breast center for mammography as needed.    No diagnosis found.    MDM  BP 115/64  Pulse 82  Temp(Src) 98.1 F (36.7 C) (Oral)  Resp 18  SpO2 99%  1. Skin lesion, anterior chest wall 2. Request thyroid check   I personally performed the services described in this documentation, which was scribed in my presence. The recorded information has been reviewed and is accurate.     Fayrene Helper, PA-C 11/30/12 1538  Fayrene Helper, PA-C 11/30/12 1540

## 2012-11-30 NOTE — ED Notes (Signed)
Pt given discharge paperwork; pt verbalized understanding of discharge and f/u; no additional questions by pt; VSS; e-signature obtained; 

## 2012-11-30 NOTE — ED Notes (Signed)
States she wants to have her thyroid level checked and wants someone to look at a knot in her L breast.

## 2012-12-03 ENCOUNTER — Encounter (HOSPITAL_COMMUNITY): Payer: Self-pay | Admitting: *Deleted

## 2012-12-07 ENCOUNTER — Ambulatory Visit (HOSPITAL_COMMUNITY)
Admission: RE | Admit: 2012-12-07 | Discharge: 2012-12-07 | Disposition: A | Discharge status: Home / Self Care | Payer: Self-pay | Source: Ambulatory Visit | Attending: Family Medicine | Admitting: Family Medicine

## 2012-12-07 ENCOUNTER — Emergency Department (HOSPITAL_COMMUNITY)
Admission: EM | Admit: 2012-12-07 | Discharge: 2012-12-07 | Discharge status: Against Medical Advice | Payer: Self-pay | Attending: Emergency Medicine | Admitting: Emergency Medicine

## 2012-12-07 DIAGNOSIS — Z0289 Encounter for other administrative examinations: Secondary | ICD-10-CM | POA: Insufficient documentation

## 2012-12-07 DIAGNOSIS — R000 Tachycardia, unspecified: Secondary | ICD-10-CM | POA: Insufficient documentation

## 2012-12-07 DIAGNOSIS — F17200 Nicotine dependence, unspecified, uncomplicated: Secondary | ICD-10-CM | POA: Insufficient documentation

## 2012-12-07 DIAGNOSIS — R7989 Other specified abnormal findings of blood chemistry: Secondary | ICD-10-CM | POA: Insufficient documentation

## 2012-12-07 NOTE — ED Notes (Signed)
Pt supposed to be at [place] for appt. Pt was confused about where to check into for procedure at 0930

## 2012-12-21 ENCOUNTER — Ambulatory Visit (HOSPITAL_COMMUNITY): Payer: Self-pay

## 2014-04-29 ENCOUNTER — Emergency Department (HOSPITAL_COMMUNITY)
Admission: EM | Admit: 2014-04-29 | Discharge: 2014-04-29 | Disposition: A | Discharge status: Home / Self Care | Payer: Self-pay | Attending: Emergency Medicine | Admitting: Emergency Medicine

## 2014-04-29 ENCOUNTER — Encounter (HOSPITAL_COMMUNITY): Payer: Self-pay | Admitting: Emergency Medicine

## 2014-04-29 DIAGNOSIS — F17200 Nicotine dependence, unspecified, uncomplicated: Secondary | ICD-10-CM | POA: Insufficient documentation

## 2014-04-29 DIAGNOSIS — R040 Epistaxis: Secondary | ICD-10-CM | POA: Insufficient documentation

## 2014-04-29 DIAGNOSIS — N63 Unspecified lump in unspecified breast: Secondary | ICD-10-CM | POA: Insufficient documentation

## 2014-04-29 DIAGNOSIS — Z859 Personal history of malignant neoplasm, unspecified: Secondary | ICD-10-CM | POA: Insufficient documentation

## 2014-04-29 DIAGNOSIS — Z862 Personal history of diseases of the blood and blood-forming organs and certain disorders involving the immune mechanism: Secondary | ICD-10-CM | POA: Insufficient documentation

## 2014-04-29 DIAGNOSIS — Z79899 Other long term (current) drug therapy: Secondary | ICD-10-CM | POA: Insufficient documentation

## 2014-04-29 LAB — BASIC METABOLIC PANEL
ANION GAP: 12 (ref 5–15)
BUN: 7 mg/dL (ref 6–23)
CHLORIDE: 101 meq/L (ref 96–112)
CO2: 29 meq/L (ref 19–32)
CREATININE: 0.31 mg/dL — AB (ref 0.50–1.10)
Calcium: 9.1 mg/dL (ref 8.4–10.5)
GFR calc non Af Amer: >90 mL/min (ref >90)
Glucose, Bld: 98 mg/dL (ref 70–99)
Potassium: 3.2 mEq/L — ABNORMAL LOW (ref 3.7–5.3)
SODIUM: 142 meq/L (ref 137–147)

## 2014-04-29 LAB — URINALYSIS, ROUTINE W REFLEX MICROSCOPIC
Bilirubin Urine: NEGATIVE
Glucose, UA: NEGATIVE mg/dL
Ketones, ur: NEGATIVE mg/dL
NITRITE: NEGATIVE
PH: 6 (ref 5.0–8.0)
Protein, ur: NEGATIVE mg/dL
SPECIFIC GRAVITY, URINE: 1.01 (ref 1.005–1.030)
UROBILINOGEN UA: 1 mg/dL (ref 0.0–1.0)

## 2014-04-29 LAB — CBC WITH DIFFERENTIAL/PLATELET
BASOS ABS: 0 10*3/uL (ref 0.0–0.1)
BASOS PCT: 0 % (ref 0–1)
Eosinophils Absolute: 0.2 10*3/uL (ref 0.0–0.7)
Eosinophils Relative: 3 % (ref 0–5)
HEMATOCRIT: 33.7 % — AB (ref 36.0–46.0)
HEMOGLOBIN: 11.3 g/dL — AB (ref 12.0–15.0)
LYMPHS PCT: 36 % (ref 12–46)
Lymphs Abs: 2.2 10*3/uL (ref 0.7–4.0)
MCH: 28.3 pg (ref 26.0–34.0)
MCHC: 33.5 g/dL (ref 30.0–36.0)
MCV: 84.5 fL (ref 78.0–100.0)
MONO ABS: 1 10*3/uL (ref 0.1–1.0)
MONOS PCT: 15 % — AB (ref 3–12)
NEUTROS ABS: 2.9 10*3/uL (ref 1.7–7.7)
NEUTROS PCT: 46 % (ref 43–77)
Platelets: 208 10*3/uL (ref 150–400)
RBC: 3.99 MIL/uL (ref 3.87–5.11)
RDW: 15.2 % (ref 11.5–15.5)
WBC: 6.3 10*3/uL (ref 4.0–10.5)

## 2014-04-29 LAB — URINE MICROSCOPIC-ADD ON

## 2014-04-29 NOTE — ED Notes (Signed)
Family at bedside. 

## 2014-04-29 NOTE — Discharge Instructions (Signed)
Nosebleed Nosebleeds can be caused by many conditions, including trauma, infections, polyps, foreign bodies, dry mucous membranes or climate, medicines, and air conditioning. Most nosebleeds occur in the front of the nose. Because of this location, most nosebleeds can be controlled by pinching the nostrils gently and continuously for at least 10 to 20 minutes. The long, continuous pressure allows enough time for the blood to clot. If pressure is released during that 10 to 20 minute time period, the process may have to be started again. The nosebleed may stop by itself or quit with pressure, or it may need concentrated heating (cautery) or pressure from packing. HOME CARE INSTRUCTIONS   If your nose was packed, try to maintain the pack inside until your health care provider removes it. If a gauze pack was used and it starts to fall out, gently replace it or cut the end off. Do not cut if a balloon catheter was used to pack the nose. Otherwise, do not remove unless instructed.  Avoid blowing your nose for 12 hours after treatment. This could dislodge the pack or clot and start the bleeding again.  If the bleeding starts again, sit up and bend forward, gently pinching the front half of your nose continuously for 20 minutes.  If bleeding was caused by dry mucous membranes, use over-the-counter saline nasal spray or gel. This will keep the mucous membranes moist and allow them to heal. If you must use a lubricant, choose the water-soluble variety. Use it only sparingly and not within several hours of lying down.  Do not use petroleum jelly or mineral oil, as these may drip into the lungs and cause serious problems.  Maintain humidity in your home by using less air conditioning or by using a humidifier.  Do not use aspirin or medicines which make bleeding more likely. Your health care provider can give you recommendations on this.  Resume normal activities as you are able, but try to avoid straining,  lifting, or bending at the waist for several days.  If the nosebleeds become recurrent and the cause is unknown, your health care provider may suggest laboratory tests. SEEK MEDICAL CARE IF: You have a fever. SEEK IMMEDIATE MEDICAL CARE IF:   Bleeding recurs and cannot be controlled.  There is unusual bleeding from or bruising on other parts of the body.  Nosebleeds continue.  There is any worsening of the condition which originally brought you in.  You become light-headed, feel faint, become sweaty, or vomit blood. MAKE SURE YOU:   Understand these instructions.  Will watch your condition.  Will get help right away if you are not doing well or get worse. Document Released: 07/02/2005 Document Revised: 02/06/2014 Document Reviewed: 08/23/2009 [facility] Patient Information 2015 [place], [place]. This information is not intended to replace advice given to you by your health care provider. Make sure you discuss any questions you have with your health care provider.      If you have concern of an abnormal place in your breast code recommend that you obtain a mammogram if you've not had one in the past 12-24 months.  Please followup with your family physician for further direction.  Mammography  Mammography is an X-ray of the breasts. The X-ray image of the breast is called a mammogram. This test can:  Find changes in the breast that are not normal.  Look for early signs of cancer.  Find cancer.  Diagnose cancer. BEFORE THE PROCEDURE  Make your test about 7 days after your  period (menses).  If you have a new doctor or clinic, send any past mammogram images to your new doctor's office.  Wash your breasts and under your arms the day of the test.  Do not wear deodorant, perfume, or powder on your body.  Wear clothes that you can change in and out of easily. PROCEDURE  Try to relax during the test.   You will get undressed from the waist up. You will put on a gown.  You  will stand in front of the X-ray machine.  Each breast will be placed between 2 plastic or glass plates. The plates will press down on your breast.  X-rays will be taken of the breast from different angles. The test should take less than 30 minutes. AFTER THE PROCEDURE  The X-ray image will be looked at carefully.  You may need to do certain parts of the test again if the images are unclear.  Ask when your test results will be ready. Get your test results.  You may go back to your normal activities. Document Released: 12/15/2011 Document Reviewed: 12/15/2011 [facility] Patient Information 2015 [place]. This information is not intended to replace advice given to you by your health care provider. Make sure you discuss any questions you have with your health care provider.

## 2014-04-29 NOTE — ED Notes (Signed)
Pt in stating that this morning she noted blood in her mouth and then her nose started bleeding, no nose bleed at this time, also reports lumps in her breast that she wants to get checked, no distress noted

## 2014-04-29 NOTE — ED Provider Notes (Addendum)
CSN: [id number]     Arrival date & time 04/29/14  1234 History   First MD Initiated Contact with Patient 04/29/14 1703     Chief Complaint  Patient presents with  . Epistaxis    That HPI  Pt in stating that this morning she noted blood in her mouth and then her nose started bleeding, no nose bleed at this time, also reports lumps in her breast that she wants to get checked, no distress noted    Past Medical History  Diagnosis Date  . Sickle cell anemia   . Seizures   . Cancer    History reviewed. No pertinent past surgical history. History reviewed. No pertinent family history. History  Substance Use Topics  . Smoking status: Current Some Day Smoker  . Smokeless tobacco: Not on file  . Alcohol Use: No   OB History   Grav Para Term Preterm Abortions TAB SAB Ect Mult Living                 Review of Systems  All other systems reviewed and are negative  Allergies  Acetaminophen; Aspirin; Fish allergy; Ibuprofen; and Peanut-containing drug products  Home Medications   Prior to Admission medications   Medication Sig Start Date End Date Taking? Authorizing Provider  atenolol (TENORMIN) 25 MG tablet Take 25 mg by mouth 2 (two) times daily. 0900 & 1700   Yes Historical Provider, MD  carbamazepine (TEGRETOL) 200 MG tablet Take 400 mg by mouth 2 (two) times daily.    Yes Historical Provider, MD  PRESCRIPTION MEDICATION Take 1 tablet by mouth 2 (two) times daily. "Sickle Cell Medication" per patient.   Yes Historical Provider, MD   BP 123/57  Pulse 99  Temp(Src) 98.3 F (36.8 C) (Oral)  Resp 16  Ht 5\' 4"  (1.626 m)  SpO2 97% Physical Exam Physical Exam  Nursing note and vitals reviewed. Constitutional: She is oriented to person, place, and time. She appears well-developed and well-nourished. No distress.  HENT:  Nose: No bleeding, no septal or turbinate abnormality to direct examination. Mouth: No blood noted posteriorly or anteriorly in the oropharynx. Head:  Normocephalic and atraumatic.  Eyes: Pupils are equal, round, and reactive to light.  Neck: Normal range of motion.  Cardiovascular: Normal rate and intact distal pulses.   Pulmonary/Chest: No respiratory distress.  Abdominal: Normal appearance. She exhibits no distension.  Musculoskeletal: Normal range of motion.  Neurological: She is alert and oriented to person, place, and time. No cranial nerve deficit.  Skin: Skin is warm and dry. No rash noted.  small follicular areas noted in between her breasts.  No evidence of cellulitis. Psychiatric: She has a normal mood and affect. Her behavior is normal.   ED Course  Procedures (including critical care time) Labs Review Labs Reviewed  BASIC METABOLIC PANEL - Abnormal; Notable for the following:    Potassium 3.2 (*)    Creatinine, Ser 0.31 (*)    All other components within normal limits  CBC WITH DIFFERENTIAL - Abnormal; Notable for the following:    Hemoglobin 11.3 (*)    HCT 33.7 (*)    Monocytes Relative 15 (*)    All other components within normal limits  URINALYSIS, ROUTINE W REFLEX MICROSCOPIC - Abnormal; Notable for the following:    Hgb urine dipstick SMALL (*)    Leukocytes, UA TRACE (*)    All other components within normal limits  URINE CULTURE  URINE MICROSCOPIC-ADD ON    Patient did state she  had a mammogram last year and she is scheduled to have another one next month.  I encouraged her to followup with mammogram and discussed the inefficiency of manual exam on discovering cancer.  She understood and will followup as we discussed.    MDM   Final diagnoses:  Epistaxis        Dot Lanes, MD 04/30/14 Antoine, MD 04/30/14 [id number]

## 2014-05-01 LAB — URINE CULTURE
Colony Count: >=100000
Special Requests: NORMAL

## 2014-07-22 ENCOUNTER — Emergency Department (HOSPITAL_COMMUNITY): Payer: No Typology Code available for payment source

## 2014-07-22 ENCOUNTER — Encounter (HOSPITAL_COMMUNITY): Payer: Self-pay | Admitting: Emergency Medicine

## 2014-07-22 ENCOUNTER — Emergency Department (HOSPITAL_COMMUNITY): Payer: Self-pay

## 2014-07-22 ENCOUNTER — Emergency Department (HOSPITAL_COMMUNITY)
Admission: EM | Admit: 2014-07-22 | Discharge: 2014-07-22 | Disposition: A | Discharge status: Home / Self Care | Payer: Self-pay | Attending: Emergency Medicine | Admitting: Emergency Medicine

## 2014-07-22 DIAGNOSIS — M549 Dorsalgia, unspecified: Secondary | ICD-10-CM

## 2014-07-22 DIAGNOSIS — Z859 Personal history of malignant neoplasm, unspecified: Secondary | ICD-10-CM | POA: Insufficient documentation

## 2014-07-22 DIAGNOSIS — R52 Pain, unspecified: Secondary | ICD-10-CM

## 2014-07-22 DIAGNOSIS — Z720 Tobacco use: Secondary | ICD-10-CM | POA: Insufficient documentation

## 2014-07-22 DIAGNOSIS — IMO0002 Reserved for concepts with insufficient information to code with codable children: Secondary | ICD-10-CM

## 2014-07-22 DIAGNOSIS — S92511A Displaced fracture of proximal phalanx of right lesser toe(s), initial encounter for closed fracture: Secondary | ICD-10-CM | POA: Insufficient documentation

## 2014-07-22 DIAGNOSIS — Z79899 Other long term (current) drug therapy: Secondary | ICD-10-CM | POA: Insufficient documentation

## 2014-07-22 DIAGNOSIS — M546 Pain in thoracic spine: Secondary | ICD-10-CM

## 2014-07-22 DIAGNOSIS — S3992XA Unspecified injury of lower back, initial encounter: Secondary | ICD-10-CM | POA: Insufficient documentation

## 2014-07-22 DIAGNOSIS — Y92410 Unspecified street and highway as the place of occurrence of the external cause: Secondary | ICD-10-CM | POA: Insufficient documentation

## 2014-07-22 DIAGNOSIS — Z862 Personal history of diseases of the blood and blood-forming organs and certain disorders involving the immune mechanism: Secondary | ICD-10-CM | POA: Insufficient documentation

## 2014-07-22 DIAGNOSIS — S92901A Unspecified fracture of right foot, initial encounter for closed fracture: Secondary | ICD-10-CM

## 2014-07-22 DIAGNOSIS — Y9301 Activity, walking, marching and hiking: Secondary | ICD-10-CM | POA: Insufficient documentation

## 2014-07-22 DIAGNOSIS — G43909 Migraine, unspecified, not intractable, without status migrainosus: Secondary | ICD-10-CM | POA: Insufficient documentation

## 2014-07-22 MED ORDER — OXYCODONE-ACETAMINOPHEN 5-325 MG PO TABS
2.0000 | ORAL_TABLET | Freq: Once | ORAL | Status: AC
  Administered 2014-07-22: 2 via ORAL
  Filled 2014-07-22: qty 2

## 2014-07-22 MED ORDER — ONDANSETRON HCL 4 MG PO TABS: 4.0000 mg | ORAL_TABLET | Freq: Three times a day (TID) | ORAL | Status: DC | PRN

## 2014-07-22 MED ORDER — ONDANSETRON 4 MG PO TBDP
4.0000 mg | ORAL_TABLET | Freq: Once | ORAL | Status: AC
  Administered 2014-07-22: 4 mg via ORAL
  Filled 2014-07-22: qty 1

## 2014-07-22 MED ORDER — OXYCODONE-ACETAMINOPHEN 5-325 MG PO TABS: 1.0000 | ORAL_TABLET | Freq: Once | ORAL | Status: DC
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
## 2014-12-07 ENCOUNTER — Emergency Department (HOSPITAL_COMMUNITY)
Admission: EM | Admit: 2014-12-07 | Discharge: 2014-12-07 | Disposition: A | Discharge status: Home / Self Care | Payer: Self-pay | Attending: Emergency Medicine | Admitting: Emergency Medicine

## 2014-12-07 ENCOUNTER — Emergency Department (HOSPITAL_COMMUNITY): Payer: Self-pay

## 2014-12-07 ENCOUNTER — Encounter (HOSPITAL_COMMUNITY): Payer: Self-pay

## 2014-12-07 DIAGNOSIS — S0990XA Unspecified injury of head, initial encounter: Secondary | ICD-10-CM | POA: Insufficient documentation

## 2014-12-07 DIAGNOSIS — Y999 Unspecified external cause status: Secondary | ICD-10-CM | POA: Insufficient documentation

## 2014-12-07 DIAGNOSIS — H53149 Visual discomfort, unspecified: Secondary | ICD-10-CM | POA: Insufficient documentation

## 2014-12-07 DIAGNOSIS — R51 Headache: Secondary | ICD-10-CM

## 2014-12-07 DIAGNOSIS — R0602 Shortness of breath: Secondary | ICD-10-CM | POA: Insufficient documentation

## 2014-12-07 DIAGNOSIS — G40909 Epilepsy, unspecified, not intractable, without status epilepticus: Secondary | ICD-10-CM | POA: Insufficient documentation

## 2014-12-07 DIAGNOSIS — S29001A Unspecified injury of muscle and tendon of front wall of thorax, initial encounter: Secondary | ICD-10-CM | POA: Insufficient documentation

## 2014-12-07 DIAGNOSIS — Y939 Activity, unspecified: Secondary | ICD-10-CM | POA: Insufficient documentation

## 2014-12-07 DIAGNOSIS — Z859 Personal history of malignant neoplasm, unspecified: Secondary | ICD-10-CM | POA: Insufficient documentation

## 2014-12-07 DIAGNOSIS — D571 Sickle-cell disease without crisis: Secondary | ICD-10-CM | POA: Insufficient documentation

## 2014-12-07 DIAGNOSIS — W19XXXA Unspecified fall, initial encounter: Secondary | ICD-10-CM | POA: Insufficient documentation

## 2014-12-07 DIAGNOSIS — K921 Melena: Secondary | ICD-10-CM | POA: Insufficient documentation

## 2014-12-07 DIAGNOSIS — R55 Syncope and collapse: Secondary | ICD-10-CM | POA: Insufficient documentation

## 2014-12-07 DIAGNOSIS — Z79899 Other long term (current) drug therapy: Secondary | ICD-10-CM | POA: Insufficient documentation

## 2014-12-07 DIAGNOSIS — R197 Diarrhea, unspecified: Secondary | ICD-10-CM | POA: Insufficient documentation

## 2014-12-07 DIAGNOSIS — Z720 Tobacco use: Secondary | ICD-10-CM | POA: Insufficient documentation

## 2014-12-07 DIAGNOSIS — R519 Headache, unspecified: Secondary | ICD-10-CM

## 2014-12-07 DIAGNOSIS — Y929 Unspecified place or not applicable: Secondary | ICD-10-CM | POA: Insufficient documentation

## 2014-12-07 DIAGNOSIS — R42 Dizziness and giddiness: Secondary | ICD-10-CM | POA: Insufficient documentation

## 2014-12-07 DIAGNOSIS — R0789 Other chest pain: Secondary | ICD-10-CM

## 2014-12-07 DIAGNOSIS — R0981 Nasal congestion: Secondary | ICD-10-CM | POA: Insufficient documentation

## 2014-12-07 LAB — I-STAT TROPONIN, ED: Troponin i, poc: 0 ng/mL (ref 0.00–0.08)

## 2014-12-07 LAB — CBC
HEMATOCRIT: 39.2 % (ref 36.0–46.0)
HEMOGLOBIN: 13.2 g/dL (ref 12.0–15.0)
MCH: 29.6 pg (ref 26.0–34.0)
MCHC: 33.7 g/dL (ref 30.0–36.0)
MCV: 87.9 fL (ref 78.0–100.0)
Platelets: 255 10*3/uL (ref 150–400)
RBC: 4.46 MIL/uL (ref 3.87–5.11)
RDW: 14.6 % (ref 11.5–15.5)
WBC: 5.6 10*3/uL (ref 4.0–10.5)

## 2014-12-07 LAB — D-DIMER, QUANTITATIVE (NOT AT ARMC): D DIMER QUANT: 0.29 ug{FEU}/mL (ref 0.00–0.48)

## 2014-12-07 LAB — BASIC METABOLIC PANEL
ANION GAP: 4 — AB (ref 5–15)
BUN: 14 mg/dL (ref 6–23)
CHLORIDE: 103 mmol/L (ref 96–112)
CO2: 29 mmol/L (ref 19–32)
CREATININE: 0.5 mg/dL (ref 0.50–1.10)
Calcium: 8.9 mg/dL (ref 8.4–10.5)
GFR calc Af Amer: >90 mL/min (ref >90)
GFR calc non Af Amer: >90 mL/min (ref >90)
Glucose, Bld: 95 mg/dL (ref 70–99)
Potassium: 4.1 mmol/L (ref 3.5–5.1)
Sodium: 136 mmol/L (ref 135–145)

## 2014-12-07 LAB — CARBAMAZEPINE LEVEL, TOTAL: Carbamazepine Lvl: <2 ug/mL — ABNORMAL LOW (ref 4.0–12.0)

## 2014-12-07 MED ORDER — METOCLOPRAMIDE HCL 5 MG/ML IJ SOLN
10.0000 mg | Freq: Once | INTRAMUSCULAR | Status: AC
Start: 2014-12-07 — End: 2014-12-07
  Administered 2014-12-07: 10 mg via INTRAVENOUS
  Filled 2014-12-07: qty 2

## 2014-12-07 MED ORDER — SODIUM CHLORIDE 0.9 % IV BOLUS (SEPSIS)
1000.0000 mL | Freq: Once | INTRAVENOUS | Status: AC
  Administered 2014-12-07: 1000 mL via INTRAVENOUS

## 2014-12-07 MED ORDER — DEXAMETHASONE SODIUM PHOSPHATE 10 MG/ML IJ SOLN
10.0000 mg | Freq: Once | INTRAMUSCULAR | Status: AC
  Administered 2014-12-07: 10 mg via INTRAVENOUS
  Filled 2014-12-07: qty 1

## 2014-12-07 MED ORDER — DIPHENHYDRAMINE HCL 50 MG/ML IJ SOLN
25.0000 mg | Freq: Once | INTRAMUSCULAR | Status: AC
  Administered 2014-12-07: 25 mg via INTRAVENOUS
  Filled 2014-12-07: qty 1

## 2014-12-07 NOTE — ED Notes (Signed)
Pt ambulated to bathroom with cup. Steady gait. Pt denies dizziness or lightheadedness. Pt reports she feels "much better" while walking to the bathroom. RN informed of situation.

## 2014-12-07 NOTE — ED Notes (Signed)
Pt A&OX4, NAD noted. Pt very sleepy on discharge. Pt denies pain or any other symptoms. Pt given discharge instructions and discharged by wheelchair.
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
## 2014-12-07 NOTE — ED Provider Notes (Addendum)
CSN: [id number]     Arrival date & time 12/07/14  1321 History   First MD Initiated Contact with Patient 12/07/14 1501     Chief Complaint  Patient presents with  . Fall  . Headache  . Dizziness  . Chest Pain     (Consider location/radiation/quality/duration/timing/severity/associated sxs/prior Treatment) Patient is a 49 y.o. female presenting with fall, headaches, dizziness, and chest pain. The history is provided by the patient.  Fall This is a new problem. The current episode started yesterday. The problem occurs constantly. The problem has not changed since onset.Associated symptoms include chest pain, headaches and shortness of breath. Pertinent negatives include no abdominal pain.  Headache Pain location:  Frontal Quality:  Sharp Radiates to:  Does not radiate Severity currently:  8/10 Severity at highest:  8/10 Onset quality:  Gradual Duration:  1 day Timing:  Constant Progression:  Worsening Chronicity:  New Similar to prior headaches: states feels similar to HA she has after having her sz.   Context: bright light and loud noise   Relieved by:  None tried Worsened by:  Light and sound Associated symptoms: congestion, diarrhea, dizziness, loss of balance, nausea, photophobia, seizures and syncope   Associated symptoms: no abdominal pain, no blurred vision, no cough, no vomiting and no weakness   Associated symptoms comment:  Pt states that yesterday after the headache started she felt dizzy and when she tries to get up she can't walk because she is off balance.  She passed out yesterday due to the dizziness and thinks she hit her head. Dizziness Associated symptoms: chest pain, diarrhea, headaches, nausea, shortness of breath and syncope   Associated symptoms: no vomiting and no weakness   Chest Pain Pain location:  Substernal area Pain quality: sharp   Pain radiates to:  Does not radiate Pain severity:  Moderate Onset quality:  Sudden Duration:  1 day Timing:   Constant Progression:  Unchanged Chronicity:  New Context comment:  Started about the same time as headache Relieved by:  None tried Worsened by:  Deep breathing (palpation of the chest) Ineffective treatments:  None tried Associated symptoms: dizziness, headache, nausea, shortness of breath and syncope   Associated symptoms: no abdominal pain, no cough, not vomiting and no weakness   Risk factors: hypertension and smoking   Risk factors: no coronary artery disease, no diabetes mellitus, no immobilization and not obese     Past Medical History  Diagnosis Date  . Sickle cell anemia   . Seizures   . Cancer    History reviewed. No pertinent past surgical history. History reviewed. No pertinent family history. History  Substance Use Topics  . Smoking status: Current Some Day Smoker  . Smokeless tobacco: Not on file  . Alcohol Use: No   OB History    No data available     Review of Systems  HENT: Positive for congestion.   Eyes: Positive for photophobia. Negative for blurred vision.  Respiratory: Positive for shortness of breath. Negative for cough.   Cardiovascular: Positive for chest pain and syncope.  Gastrointestinal: Positive for nausea and diarrhea. Negative for vomiting and abdominal pain.       Loose stool intermittently and straining today produced some bright red blood in the stool.  Denies frequent bloody stools  Neurological: Positive for dizziness, seizures, headaches and loss of balance. Negative for weakness.       Hx of seizures.  Last seizure was 2 weeks ago.  Still taking tegretol.  All other systems  reviewed and are negative.     Allergies  Acetaminophen; Aspirin; Fish allergy; Ibuprofen; and Peanut-containing drug products  Home Medications   Prior to Admission medications   Medication Sig Start Date End Date Taking? Authorizing Provider  atenolol (TENORMIN) 25 MG tablet Take 25 mg by mouth 2 (two) times daily. 0900 & 1700    Historical Provider, MD   carbamazepine (TEGRETOL) 200 MG tablet Take 400 mg by mouth 2 (two) times daily.     Historical Provider, MD  ondansetron (ZOFRAN) 4 MG tablet Take 1 tablet (4 mg total) by mouth every 8 (eight) hours as needed for nausea or vomiting. 07/22/14   Margarita Mail, PA-C  oxyCODONE-acetaminophen (PERCOCET/ROXICET) 5-325 MG per tablet Take 1-2 tablets by mouth once. 07/22/14   Margarita Mail, PA-C  PRESCRIPTION MEDICATION Take 1 tablet by mouth 2 (two) times daily. "Sickle Cell Medication" per patient.    Historical Provider, MD   BP 112/63 mmHg  Pulse 73  Temp(Src) 98.3 F (36.8 C) (Oral)  Resp 18  Ht 5\' 4"  (1.626 m)  Wt 176 lb (79.833 kg)  BMI 30.20 kg/m2  SpO2 98%  LMP 06/22/2014 Physical Exam  Constitutional: She is oriented to person, place, and time. She appears well-developed and well-nourished. She appears distressed.  HENT:  Head: Normocephalic and atraumatic.  Right Ear: Tympanic membrane normal.  Left Ear: Tympanic membrane normal.  Mouth/Throat: Oropharynx is clear and moist.  Eyes: Conjunctivae and EOM are normal. Pupils are equal, round, and reactive to light. Right eye exhibits no discharge. Left eye exhibits no discharge.  photophobia  Neck: Normal range of motion. Neck supple. No spinous process tenderness present. No rigidity. No Brudzinski's sign and no Kernig's sign noted.  Cardiovascular: Normal rate, normal heart sounds and intact distal pulses.   No murmur heard. Pulmonary/Chest: Effort normal and breath sounds normal. No respiratory distress. She has no wheezes. She has no rales.  Abdominal: Soft. She exhibits no distension. There is no tenderness.  Musculoskeletal: Normal range of motion. She exhibits no edema or tenderness.  Lymphadenopathy:    She has no cervical adenopathy.  Neurological: She is alert and oriented to person, place, and time. She has normal strength. No cranial nerve deficit or sensory deficit. GCS eye subscore is 4. GCS verbal subscore is 5.  GCS motor subscore is 6.  Unable to ambulate at this time.  No visual field cuts.  Skin: Skin is warm and dry.  Psychiatric: She has a normal mood and affect. Her behavior is normal.  Nursing note and vitals reviewed.   ED Course  Procedures (including critical care time) Labs Review Labs Reviewed  BASIC METABOLIC PANEL - Abnormal; Notable for the following:    Anion gap 4 (*)    All other components within normal limits  CBC  I-STAT TROPOININ, ED    Imaging Review Dg Chest 2 View  12/07/2014   CLINICAL DATA:  49 year old female with history of trauma from a fall yesterday complaining of dizziness and chest pain.  EXAM: CHEST  2 VIEW  COMPARISON:  Chest x-ray 08/29/2011.  FINDINGS: Lung volumes are normal. No consolidative airspace disease. No pleural effusions. No pneumothorax. No pulmonary nodule or mass noted. Pulmonary vasculature and the cardiomediastinal silhouette are within normal limits. Atherosclerosis in the thoracic aorta.  IMPRESSION: 1.  No radiographic evidence of acute cardiopulmonary disease. 2. Atherosclerosis.   Electronically Signed   By: Vinnie Langton M.D.   On: 12/07/2014 14:57     EKG Interpretation  Date/Time:  Thursday December 07 2014 13:43:27 EST Ventricular Rate:  71 PR Interval:  154 QRS Duration: 82 QT Interval:  402 QTC Calculation: 436 R Axis:   67 Text Interpretation:  Normal sinus rhythm Normal ECG No significant change  since last tracing Confirmed by Maryan Rued  MD, Loree Fee ([id number]) on 12/07/2014  3:03:00 PM      MDM   Final diagnoses:  Headache  Atypical chest pain    Patient with a history of sickle cell anemia, seizures on Tegretol who presents today with a severe headache that started yesterday. She says she normally gets a headache like this after having a seizure but her last seizure was approximately 2 weeks ago. Also she complains of nausea and feeling dizzy when she attempts to walk causing her to fall. She denies any abdominal  pain, unilateral weakness, swallowing difficulty or speech problems. She has never required a transfusion for her sickle cell anemia that she can recall. She has no prior history of cardiac problems but has also noted chest pain since yesterday worse with taking a deep breath and feeling slightly short of breath.  As far as the patient knows she has never had a stress test or echo.  Patient is photophobic on exam without focal deficits however unable to get patient out of bed to walk at this time to test for ataxia. Patient has chest wall tenderness on exam but does not know if she has sickle cell crises and states her chest is not formally hurt. She denies trauma to the chest but states yesterday with the headache she fell and hit her head.  Headache concerning for possible intracranial hemorrhage versus migraine versus possible seizure the patient was unaware of what the following headache from trauma. Also concern for chest pain with possible coronary artery disease versus PE versus musculoskeletal chest pain.  EKG is within normal limits and initial troponin is 0. Chest x-ray shows no acute cardiopulmonary disease but does show atherosclerosis. BMP within normal limits.  Patient given headache cocktail as well as a head CT and d-dimer pending  7:07 PM After headache cocktail patient states her headache has completely resolved and she feels much better. She was able to ambulate to the bathroom with no signs of ataxia or neurologic deficit. D-dimer is negative and troponin was normal. After having chest pain for greater than 24 hours and normal troponin feel most likely is unlikely to be ACS. Most likely chest wall pain. No signs concerning for acute chest. CT of the head is within normal limits. Will have patient follow-up with her PCP if symptoms persist and was given follow-up with [facility] for provocative testing if chest pain continues.  Blanchie Dessert, MD 12/07/14 1908  Blanchie Dessert,  MD 12/07/14 [phone]

## 2015-11-14 DIAGNOSIS — F209 Schizophrenia, unspecified: Secondary | ICD-10-CM

## 2015-11-14 DIAGNOSIS — Z5900 Homelessness unspecified: Secondary | ICD-10-CM

## 2015-11-14 NOTE — Congregational Nurse Program (Signed)
11/14/15 - Client presented with concerns regarding seeing snakes and dogs on the wall.  Client has a history of schizophrenia and sees providers at [facility].  Next appointment for her there is November 20, 2015.  Client is positive for suicidal ideation in the past with a plan.  Client is on no medications for depression at present or her Seroquel.  She would like to get back on her Seroquel.  Client denies SI at present.  Client will be seeing Marliss Coots, FNP today for an appointment and stated,"I will tell her about the suicide thoughts and getting some more Seroquel." Client encouraged to keep appointment with Audrea Muscat today.  Client is waiting now in dayroom for her appointment.

## 2016-05-29 ENCOUNTER — Encounter (HOSPITAL_COMMUNITY): Payer: Self-pay | Admitting: Emergency Medicine

## 2016-05-29 ENCOUNTER — Inpatient Hospital Stay (HOSPITAL_COMMUNITY)
Admission: EM | Admit: 2016-05-29 | Discharge: 2016-05-31 | DRG: 159 | Disposition: A | Discharge status: Home / Self Care | Payer: Medicaid Other | Attending: Internal Medicine | Admitting: Internal Medicine

## 2016-05-29 DIAGNOSIS — K047 Periapical abscess without sinus: Principal | ICD-10-CM

## 2016-05-29 DIAGNOSIS — J029 Acute pharyngitis, unspecified: Secondary | ICD-10-CM | POA: Diagnosis present

## 2016-05-29 DIAGNOSIS — R519 Headache, unspecified: Secondary | ICD-10-CM

## 2016-05-29 DIAGNOSIS — R6884 Jaw pain: Secondary | ICD-10-CM

## 2016-05-29 DIAGNOSIS — R1013 Epigastric pain: Secondary | ICD-10-CM | POA: Diagnosis present

## 2016-05-29 DIAGNOSIS — Z8249 Family history of ischemic heart disease and other diseases of the circulatory system: Secondary | ICD-10-CM

## 2016-05-29 DIAGNOSIS — E876 Hypokalemia: Secondary | ICD-10-CM | POA: Diagnosis present

## 2016-05-29 DIAGNOSIS — R079 Chest pain, unspecified: Secondary | ICD-10-CM

## 2016-05-29 DIAGNOSIS — Z886 Allergy status to analgesic agent status: Secondary | ICD-10-CM

## 2016-05-29 DIAGNOSIS — E039 Hypothyroidism, unspecified: Secondary | ICD-10-CM | POA: Diagnosis present

## 2016-05-29 DIAGNOSIS — Z79899 Other long term (current) drug therapy: Secondary | ICD-10-CM

## 2016-05-29 DIAGNOSIS — Z91010 Allergy to peanuts: Secondary | ICD-10-CM

## 2016-05-29 DIAGNOSIS — G40909 Epilepsy, unspecified, not intractable, without status epilepticus: Secondary | ICD-10-CM

## 2016-05-29 DIAGNOSIS — I119 Hypertensive heart disease without heart failure: Secondary | ICD-10-CM | POA: Diagnosis present

## 2016-05-29 DIAGNOSIS — D571 Sickle-cell disease without crisis: Secondary | ICD-10-CM | POA: Diagnosis present

## 2016-05-29 DIAGNOSIS — R109 Unspecified abdominal pain: Secondary | ICD-10-CM

## 2016-05-29 DIAGNOSIS — R51 Headache: Secondary | ICD-10-CM

## 2016-05-29 DIAGNOSIS — R000 Tachycardia, unspecified: Secondary | ICD-10-CM

## 2016-05-29 DIAGNOSIS — Z91013 Allergy to seafood: Secondary | ICD-10-CM

## 2016-05-29 DIAGNOSIS — K029 Dental caries, unspecified: Secondary | ICD-10-CM | POA: Diagnosis present

## 2016-05-29 DIAGNOSIS — F17200 Nicotine dependence, unspecified, uncomplicated: Secondary | ICD-10-CM | POA: Diagnosis present

## 2016-05-29 DIAGNOSIS — R0902 Hypoxemia: Secondary | ICD-10-CM

## 2016-05-29 DIAGNOSIS — R509 Fever, unspecified: Secondary | ICD-10-CM

## 2016-05-29 LAB — CBC WITH DIFFERENTIAL/PLATELET
BASOS PCT: 0 %
Basophils Absolute: 0 10*3/uL (ref 0.0–0.1)
EOS ABS: 0.1 10*3/uL (ref 0.0–0.7)
Eosinophils Relative: 1 %
HCT: 31.4 % — ABNORMAL LOW (ref 36.0–46.0)
HEMOGLOBIN: 10.2 g/dL — AB (ref 12.0–15.0)
Lymphocytes Relative: 26 %
Lymphs Abs: 1.7 10*3/uL (ref 0.7–4.0)
MCH: 27.5 pg (ref 26.0–34.0)
MCHC: 32.5 g/dL (ref 30.0–36.0)
MCV: 84.6 fL (ref 78.0–100.0)
Monocytes Absolute: 1.1 10*3/uL — ABNORMAL HIGH (ref 0.1–1.0)
Monocytes Relative: 16 %
NEUTROS PCT: 57 %
Neutro Abs: 3.8 10*3/uL (ref 1.7–7.7)
Platelets: 195 10*3/uL (ref 150–400)
RBC: 3.71 MIL/uL — AB (ref 3.87–5.11)
RDW: 15.1 % (ref 11.5–15.5)
WBC: 6.7 10*3/uL (ref 4.0–10.5)

## 2016-05-29 LAB — COMPREHENSIVE METABOLIC PANEL
ALBUMIN: 2.9 g/dL — AB (ref 3.5–5.0)
ALT: 20 U/L (ref 14–54)
ANION GAP: 8 (ref 5–15)
AST: 26 U/L (ref 15–41)
Alkaline Phosphatase: 167 U/L — ABNORMAL HIGH (ref 38–126)
BILIRUBIN TOTAL: 0.9 mg/dL (ref 0.3–1.2)
BUN: <5 mg/dL — ABNORMAL LOW (ref 6–20)
CALCIUM: 8.8 mg/dL — AB (ref 8.9–10.3)
CO2: 24 mmol/L (ref 22–32)
Chloride: 104 mmol/L (ref 101–111)
Creatinine, Ser: 0.38 mg/dL — ABNORMAL LOW (ref 0.44–1.00)
GFR calc non Af Amer: >60 mL/min (ref >60)
GLUCOSE: 90 mg/dL (ref 65–99)
POTASSIUM: 3.2 mmol/L — AB (ref 3.5–5.1)
Sodium: 136 mmol/L (ref 135–145)
Total Protein: 6.4 g/dL — ABNORMAL LOW (ref 6.5–8.1)

## 2016-05-29 LAB — URINALYSIS, ROUTINE W REFLEX MICROSCOPIC
BILIRUBIN URINE: NEGATIVE
Glucose, UA: NEGATIVE mg/dL
Hgb urine dipstick: NEGATIVE
Ketones, ur: 15 mg/dL — AB
NITRITE: NEGATIVE
Protein, ur: NEGATIVE mg/dL
SPECIFIC GRAVITY, URINE: 1.013 (ref 1.005–1.030)
pH: 6.5 (ref 5.0–8.0)

## 2016-05-29 LAB — URINE MICROSCOPIC-ADD ON

## 2016-05-29 LAB — I-STAT CG4 LACTIC ACID, ED: Lactic Acid, Venous: 1.12 mmol/L (ref 0.5–1.9)

## 2016-05-29 MED ORDER — LORAZEPAM 2 MG/ML IJ SOLN
0.5000 mg | Freq: Once | INTRAMUSCULAR | Status: AC
  Administered 2016-05-29: 0.5 mg via INTRAVENOUS
  Filled 2016-05-29: qty 1

## 2016-05-29 MED ORDER — SODIUM CHLORIDE 0.9 % IV BOLUS (SEPSIS)
1000.0000 mL | Freq: Once | INTRAVENOUS | Status: AC
  Administered 2016-05-29: 1000 mL via INTRAVENOUS

## 2016-05-29 MED ORDER — ONDANSETRON 4 MG PO TBDP
4.0000 mg | ORAL_TABLET | Freq: Once | ORAL | Status: AC
Start: 2016-05-29 — End: 2016-05-29
  Administered 2016-05-29: 4 mg via ORAL
  Filled 2016-05-29: qty 1

## 2016-05-29 MED ORDER — PIPERACILLIN-TAZOBACTAM 3.375 G IVPB 30 MIN
3.3750 g | Freq: Once | INTRAVENOUS | Status: AC
  Administered 2016-05-29: 3.375 g via INTRAVENOUS
  Filled 2016-05-29: qty 50

## 2016-05-29 MED ORDER — VANCOMYCIN HCL IN DEXTROSE 1-5 GM/200ML-% IV SOLN: 1000.0000 mg | Freq: Once | INTRAVENOUS | Status: DC

## 2016-05-29 MED ORDER — SODIUM CHLORIDE 0.9 % IV BOLUS (SEPSIS)
1000.0000 mL | Freq: Once | INTRAVENOUS | Status: DC
  Administered 2016-05-29: 1000 mL via INTRAVENOUS

## 2016-05-29 MED ORDER — VANCOMYCIN HCL 10 G IV SOLR
1500.0000 mg | Freq: Once | INTRAVENOUS | Status: AC
  Administered 2016-05-29: 1500 mg via INTRAVENOUS
  Filled 2016-05-29: qty 1500

## 2016-05-29 MED ORDER — SODIUM CHLORIDE 0.9 % IV BOLUS (SEPSIS)
500.0000 mL | Freq: Once | INTRAVENOUS | Status: AC
  Administered 2016-05-29: 500 mL via INTRAVENOUS

## 2016-05-29 MED ORDER — POTASSIUM CHLORIDE IN NACL 40-0.9 MEQ/L-% IV SOLN
INTRAVENOUS | Status: DC
  Administered 2016-05-30 (×2): 100 mL/h via INTRAVENOUS
  Filled 2016-05-29: qty 1000

## 2016-05-29 MED ORDER — ONDANSETRON HCL 4 MG/2ML IJ SOLN
4.0000 mg | Freq: Once | INTRAMUSCULAR | Status: AC
  Administered 2016-05-29: 4 mg via INTRAVENOUS
  Filled 2016-05-29: qty 2

## 2016-05-29 MED ORDER — PIPERACILLIN-TAZOBACTAM 3.375 G IVPB: 3.3750 g | Freq: Three times a day (TID) | INTRAVENOUS | Status: DC

## 2016-05-29 MED ORDER — HYDROMORPHONE HCL 1 MG/ML IJ SOLN: 1.0000 mg | Freq: Once | INTRAMUSCULAR | Status: DC

## 2016-05-29 MED ORDER — HYDROMORPHONE HCL 1 MG/ML IJ SOLN
1.0000 mg | Freq: Once | INTRAMUSCULAR | Status: AC
  Administered 2016-05-29: 1 mg via INTRAVENOUS
  Filled 2016-05-29: qty 1

## 2016-05-29 MED ORDER — SODIUM CHLORIDE 0.9 % IV BOLUS (SEPSIS): 1000.0000 mL | Freq: Once | INTRAVENOUS | Status: DC

## 2016-05-29 MED ORDER — OXYCODONE HCL 5 MG PO TABS
5.0000 mg | ORAL_TABLET | Freq: Once | ORAL | Status: AC
  Administered 2016-05-29: 5 mg via ORAL
  Filled 2016-05-29: qty 1

## 2016-05-29 MED ORDER — PIPERACILLIN-TAZOBACTAM 3.375 G IVPB
3.3750 g | Freq: Three times a day (TID) | INTRAVENOUS | Status: DC
  Administered 2016-05-30 – 2016-05-31 (×4): 3.375 g via INTRAVENOUS
  Filled 2016-05-29 (×5): qty 50

## 2016-05-29 MED ORDER — VANCOMYCIN HCL IN DEXTROSE 1-5 GM/200ML-% IV SOLN
1000.0000 mg | Freq: Two times a day (BID) | INTRAVENOUS | Status: DC
  Administered 2016-05-30 (×2): 1000 mg via INTRAVENOUS
  Filled 2016-05-29 (×3): qty 200

## 2016-05-29 NOTE — H&P (Addendum)
History and Physical    Marie Parsons [id number] DOB: [birth year] DOA: 05/29/2016  PCP: Carmie Kanner, NP   Patient coming from: Home.  Chief Complaint: Toothache and fever.  HPI: Marie Parsons is a 50 y.o. female with medical history significant of epilepsy, sickle cell anemia (no history of sickle cell crisis) who comes to the emergency department with a 2 day history of right-sided mandibular pain/swelling, palpitations and right-sided chest pain for 2 days. She denies any history of trauma to either area. She denies fever, chills, dyspnea, pitting edema of the lower extremities. She denies abdominal pain, diarrhea or constipation, but states that she had 3 episodes of nausea with emesis.   History was difficult to obtain as the patient frequently complains of pain, requests analgesic medications, but falls asleep easily.  ED Course: The was initially tachycardic and febrile. She received IV fluids, IV antibiotics, analgesics and anxiolytics reporting minimal relief. However, she dozes off easily during interview and exam.  The patient had a disheveled appearance. Workup was significant for anemia at 10.2 g/dL and albumin was 2.9 g/dL.   Review of Systems: As per HPI otherwise 10 point review of systems negative.     Past Medical History:  Diagnosis Date  . Cancer ([place])   . Seizures ([place])   . Sickle cell anemia ([facility])     History reviewed. No pertinent surgical history.   reports that she has been smoking.  She has never used smokeless tobacco. She reports that she does not drink alcohol or use drugs.  Allergies  Allergen Reactions  . Acetaminophen Nausea Only and Other (See Comments)    Seizures   . Aspirin Nausea Only and Other (See Comments)    Causes seizures   . Fish Allergy Other (See Comments)    Flounder and crover seizures  . Ibuprofen Nausea Only and Other (See Comments)    Seizures   . Peanut-Containing Drug Products Nausea And Vomiting    Family  history  Mother-hypertension.   Prior to Admission medications   Medication Sig Start Date End Date Taking? Authorizing Provider  atenolol (TENORMIN) 25 MG tablet Take 25 mg by mouth 2 (two) times daily. 0900 & 1700    Historical Provider, MD  carbamazepine (TEGRETOL) 200 MG tablet Take 400 mg by mouth 2 (two) times daily.     Historical Provider, MD  ondansetron (ZOFRAN) 4 MG tablet Take 1 tablet (4 mg total) by mouth every 8 (eight) hours as needed for nausea or vomiting. 07/22/14   Margarita Mail, PA-C  oxyCODONE-acetaminophen (PERCOCET/ROXICET) 5-325 MG per tablet Take 1-2 tablets by mouth once. 07/22/14   Margarita Mail, PA-C    Physical Exam: Vitals:   05/29/16 2200 05/29/16 2223 05/29/16 2230 05/29/16 2330  BP: 128/66  122/72 113/62  Pulse: 113  111 118  Resp: (!) 33  (!) 30 (!) 29  Temp:  100.3 F (37.9 C)    TempSrc:  Rectal    SpO2: 95%  97% 93%  Weight:          Constitutional: Looks acutely ill. Vitals:   05/29/16 2200 05/29/16 2223 05/29/16 2230 05/29/16 2330  BP: 128/66  122/72 113/62  Pulse: 113  111 118  Resp: (!) 33  (!) 30 (!) 29  Temp:  100.3 F (37.9 C)    TempSrc:  Rectal    SpO2: 95%  97% 93%  Weight:       Eyes: PERRL, lids and conjunctivae are injected ENMT: Mucous membranes  are mildly dry. Posterior pharynx clear of any exudate or lesions. Dentition is in poor state of repair. Significant halitosis. Neck:  Supple, Positive cervical adenopathy, Mild thyromegaly Respiratory: clear to auscultation bilaterally, no wheezing, no crackles. Normal respiratory effort. No accessory muscle use.  Cardiovascular: Tachycardic at 116 bpm with a regular rhythm, no murmurs / rubs / gallops. No extremity edema. 2+ pedal pulses. No carotid bruits.  Abdomen: Bowel sounds positive. Soft, no tenderness, no masses palpated. No hepatosplenomegaly.  Musculoskeletal: no clubbing / cyanosis. No joint deformity upper and lower extremities. Good ROM, no contractures. Normal  muscle tone.  Skin: Several suprasternal mildly indurated, tender to palpation lesions. Neurologic: CN 2-12 grossly intact. Sensation intact, DTR normal. Strength 5/5 in all 4.  Psychiatric: Initially is sleeping, but easily awakened. Then Alert and oriented x 4. Anxious and tearful due to pain alternating with moments of drowsiness (the patient was given hydromorphone, oxycodone and Ativan earlier).    Labs on Admission: I have personally reviewed following labs and imaging studies  CBC:  Recent Labs Lab 05/29/16 2106  WBC 6.7  NEUTROABS 3.8  HGB 10.2*  HCT 31.4*  MCV 84.6  PLT 0000000   Basic Metabolic Panel:  Recent Labs Lab 05/29/16 2106  NA 136  K 3.2*  CL 104  CO2 24  GLUCOSE 90  BUN <5*  CREATININE 0.38*  CALCIUM 8.8*   GFR: CrCl cannot be calculated (Unknown ideal weight.). Liver Function Tests:  Recent Labs Lab 05/29/16 2106  AST 26  ALT 20  ALKPHOS 167*  BILITOT 0.9  PROT 6.4*  ALBUMIN 2.9*   Urine analysis:    Component Value Date/Time   COLORURINE YELLOW 05/29/2016 2127   APPEARANCEUR CLEAR 05/29/2016 2127   LABSPEC 1.013 05/29/2016 2127   PHURINE 6.5 05/29/2016 2127   GLUCOSEU NEGATIVE 05/29/2016 2127   HGBUR NEGATIVE 05/29/2016 2127   BILIRUBINUR NEGATIVE 05/29/2016 2127   KETONESUR 15 (A) 05/29/2016 2127   PROTEINUR NEGATIVE 05/29/2016 2127   UROBILINOGEN 1.0 04/29/2014 1830   NITRITE NEGATIVE 05/29/2016 2127   LEUKOCYTESUR SMALL (A) 05/29/2016 2127    EKG: Independently reviewed. Vent. rate 109 BPM PR interval 142 ms QRS duration 78 ms QT/QTc 328/441 ms P-R-T axes 72 23 63 Sinus tachycardia Left atrial enlargement Borderline ECG  Assessment/Plan Principal Problem:   Fever Admit to telemetry/observation. Continue IV fluids. Per patient, she is unable to use acetaminophen or NSAIDs due to seizures. However, she has been using Percocet at home without any significant side effects. Continue IV antibiotics. Follow-up blood  cultures and sensitivity.  Active Problems:   Acute facial pain Continue IV antibiotics. Oxycodone as needed for pain. Check maxillofacial CT scan.    Right-sided chest pain Pain is reproducible. EKG shows sinus tachycardia, but was nonischemic. I will check CT of the chest angiogram.    Seizure disorder (HCC) Continue carbamazepine. Check carbamazepine level.    Tachycardia Likely due to fever and stress. However, given hypoxia will rule out PE.    Hypokalemia We will replace through IV fluids. Check magnesium level. Follow-up potassium level in the morning.    DVT prophylaxis: Lovenox SQ. Code Status: Full code. Family Communication: NA Disposition Plan: Admit to continue IV antibiotics and perform further workup. Consults called: NA Admission status: Observation/telemetry   Reubin Milan MD [facility] Pager [phone].  If 7PM-7AM, please contact night-coverage [email or web address] Password [email or web address]  05/29/2016, 11:58 PM   Addendum:  CLINICAL DATA:  Hypoxia and tachycardia.  EXAM: CT ANGIOGRAPHY CHEST WITH  CONTRAST  TECHNIQUE: Multidetector CT imaging of the chest was performed using the standard protocol during bolus administration of intravenous contrast. Multiplanar CT image reconstructions and MIPs were obtained to evaluate the vascular anatomy.  CONTRAST:  100 cc Isovue 370 intravenous  COMPARISON:  None available  FINDINGS: Cardiovascular: Study optimized for the pulmonary arteries. No filling defect to suggest pulmonary embolism. No evidence of acute aortic syndrome. Borderline cardiomegaly. No pericardial effusion. Atherosclerotic calcifications seen along the aorta and coronaries.  Mediastinum: Symmetric, partly seen goiter. Mild nonspecific enlargement of hilar and mediastinal lymph nodes. Hilar lymph nodes measure up to 1 cm.  Lungs/Pleura: Expiratory phase with dependent atelectasis. Diffuse airway thickening.  Centrilobular emphysema. Bilateral septal lines may be atelectatic; no definitive pulmonary edema  Upper abdomen: No acute findings. Replaced right hepatic artery to the SMA.  Musculoskeletal: Exaggerated thoracic kyphosis. No acute or aggressive finding.  Review of the MIP images confirms the above findings.  IMPRESSION: 1. No evidence of pulmonary embolism. 2.  Emphysema. (ICD10-J43.9) and airway thickening. 3. Mild nonspecific mediastinal and hilar adenopathy, potentially related to #2. Correlate with labs. 4. Atherosclerosis, including the coronary arteries. 5. Goiter.   Electronically Signed   By: Monte Fantasia M.D.   On: 05/30/2016 01:44   CLINICAL DATA:  Acute facial pain, reportedly dental pain on the right  EXAM: CT MAXILLOFACIAL WITHOUT CONTRAST  TECHNIQUE: Multidetector CT imaging of the maxillofacial structures was performed. Multiplanar CT image reconstructions were also generated. A small metallic BB was placed on the right temple in order to reliably differentiate right from left.  COMPARISON:  None.  FINDINGS: There are cavities throughout the bilateral maxillary and mandibular teeth, worse in the mandible where there are large periapical erosions around the right-sided remaining molars, right canine, and bilateral first premolars. Notable given the acute inflammation, there is also periapical erosion of the right upper terminal molar.  Asymmetric inflammation around the right face, seen in the buccal fat pad and tracking along the lower posterior right maxillary sinus. Given reported dental pain this is presumably active odontogenic infection. There is right maxillary sinusitis, but chronic appearing and without periantral fat edema to suggest invasive sinusitis. Mucosal thickening is greatest on the floor of the right maxillary sinus where there is likely large mucous retention cyst. No evidence of floor mouth swelling or  airway compromise. Mild enlargement of cervical lymph nodes which may be reactive in this setting.  Polyp or retention cyst in the inferior right frontal sinus, nonobstructive. Negative orbital and partial intracranial imaging.  IMPRESSION: Right face inflammation/infection, favored odontogenic. No visible abscess, but limited without IV contrast.   Electronically Signed   By: Monte Fantasia M.D.   On: 05/30/2016 01:32  Tennis Must, M.D.

## 2016-05-29 NOTE — ED Notes (Signed)
Pt refusing rectal temperature, becoming highly anxious and shaky. Axillary temperature of 100.40F.

## 2016-05-29 NOTE — Progress Notes (Signed)
Pharmacy Antibiotic Note  Marie Parsons is a 50 y.o. female admitted on 05/29/2016 with dental pain and R-sided chest pain. Planning to start empiric antibiotics from possible sepsis. LA 1.1, Scr 0.38, afebrile.  Plan: -Vancomycin 1500 mg IV x1 then 1g/12h -Zosyn 3.375 g IV q8h -Monitor renal fx, cultures, VT as needed  Weight: 178 lb 2 oz (80.8 kg)  Temp (24hrs), Avg:98.6 F (37 C), Min:98.1 F (36.7 C), Max:99 F (37.2 C)   Recent Labs Lab 05/29/16 2106 05/29/16 2123  WBC 6.7  --   LATICACIDVEN  --  1.12    CrCl cannot be calculated (Unknown ideal weight.).    Allergies  Allergen Reactions  . Acetaminophen Nausea Only and Other (See Comments)    Seizures   . Aspirin Nausea Only and Other (See Comments)    Causes seizures   . Fish Allergy Other (See Comments)    Flounder and crover seizures  . Ibuprofen Nausea Only and Other (See Comments)    Seizures   . Peanut-Containing Drug Products Nausea And Vomiting    Antimicrobials this admission: 8/24 vancomcyin > 8/24 zosyn >  Dose adjustments this admission: NA  Microbiology results: NA  Thank you for allowing pharmacy to be a part of this patient's care.  Harvel Quale 05/29/2016 9:35 PM

## 2016-05-29 NOTE — ED Triage Notes (Signed)
Pt here with dental pain and swelling since 2 days ago. Pt also sts her "heart keeps skipping." Pt reports minor CP, but sts her mouth hurts "a lot."

## 2016-05-29 NOTE — ED Notes (Signed)
Patient peed in the floor.  This RN found the patient standing in the room surrounded by pee stating "I could not see where to unhook myself".  I assisted the patient in to a new gown, cleaned the cords, wiped the floor and reconnected to the monitor.

## 2016-05-29 NOTE — ED Provider Notes (Signed)
[facility] DEPT Provider Note   CSN: [id number] Arrival date & time: 05/29/16  1606     History   Chief Complaint Chief Complaint  Patient presents with  . Dental Pain  . Palpitations    HPI Annamary IZZABELLA CROWL is a 50 y.o. female.  HPI  Patient with PMH of seizure and sickle cell anemia as well as hypothyroidism presents to the ER with complaints of severe right sided dental pain and right sided chest pain. The symptoms started two days ago and she has associated facial swelling to affected tooth. On arrival she is tachycardic at 120 which she says she is in severe pain. The pain in her chest she says is mild at rest but worsens if she touches her chest and with certain movements. The patient is disheveled.  No fever, N/V/D, no drooling, choking, tongue protrusion, diarrhea, belly pain or lower extremity swelling.  Past Medical History:  Diagnosis Date  . Cancer ([place])   . Seizures ([place])   . Sickle cell anemia [facility])     Patient Active Problem List   Diagnosis Date Noted  . Fever 05/29/2016  . Seizure disorder ([place]) 05/29/2016  . Tachycardia 05/29/2016  . Hypokalemia 05/29/2016  . THYROID STIMULATING HORMONE, ABNORMAL 10/10/2009  . CONSTIPATION, CHRONIC 10/10/2009  . GALACTORRHEA 10/10/2009    History reviewed. No pertinent surgical history.  OB History    No data available       Home Medications    Prior to Admission medications   Medication Sig Start Date End Date Taking? Authorizing Provider  atenolol (TENORMIN) 25 MG tablet Take 25 mg by mouth 2 (two) times daily. 0900 & 1700    Historical Provider, MD  carbamazepine (TEGRETOL) 200 MG tablet Take 400 mg by mouth 2 (two) times daily.     Historical Provider, MD  ondansetron (ZOFRAN) 4 MG tablet Take 1 tablet (4 mg total) by mouth every 8 (eight) hours as needed for nausea or vomiting. 07/22/14   Margarita Mail, PA-C  oxyCODONE-acetaminophen (PERCOCET/ROXICET) 5-325 MG per tablet Take 1-2 tablets by mouth  once. 07/22/14   Margarita Mail, PA-C    Family History History reviewed. No pertinent family history.  Social History Social History  Substance Use Topics  . Smoking status: Current Some Day Smoker  . Smokeless tobacco: Never Used  . Alcohol use No     Allergies   Acetaminophen; Aspirin; Fish allergy; Ibuprofen; and Peanut-containing drug products   Review of Systems Review of Systems Review of Systems All other systems negative except as documented in the HPI. All pertinent positives and negatives as reviewed in the HPI.   Physical Exam Updated Vital Signs BP 108/62   Pulse 109   Temp 100.3 F (37.9 C) (Rectal)   Resp (!) 29   Wt 80.8 kg   LMP 06/22/2014   SpO2 94%   BMI 30.58 kg/m   Physical Exam  Constitutional: She is oriented to person, place, and time. She appears well-developed and well-nourished. She appears distressed (due to pain).  HENT:  Head: Normocephalic and atraumatic.  Right Ear: Tympanic membrane, external ear and ear canal normal.  Left Ear: Tympanic membrane, external ear and ear canal normal.  Nose: Nose normal. No rhinorrhea. Right sinus exhibits no maxillary sinus tenderness and no frontal sinus tenderness. Left sinus exhibits no maxillary sinus tenderness and no frontal sinus tenderness.  Mouth/Throat: Uvula is midline and mucous membranes are normal. No trismus in the jaw. Normal dentition. Dental caries present. No dental  abscesses or uvula swelling. No oropharyngeal exudate, posterior oropharyngeal edema, posterior oropharyngeal erythema or tonsillar abscesses.  No submental edema, tongue not elevated, no trismus. No impending airway obstruction; Pt able to speak full sentences, swallow intact, no drooling, stridor, or tonsillar/uvula displacement. No palatal petechia  Eyes: Conjunctivae are normal.  Neck: Trachea normal, normal range of motion and full passive range of motion without pain. Neck supple. No neck rigidity. Normal range of  motion present. No Brudzinski's sign noted.  Flexion and extension of neck without pain or difficulty. Able to breath without difficulty in extension.  Cardiovascular: Regular rhythm.  Tachycardia present.   Pulmonary/Chest: Effort normal and breath sounds normal. No stridor. No respiratory distress. She has no wheezes. Chest wall is not dull to percussion. She exhibits tenderness. She exhibits no mass, no laceration, no crepitus, no edema, no deformity, no swelling and no retraction.    Abdominal: Soft. There is no tenderness.  No obvious evidence of splenomegaly. Non ttp.   Musculoskeletal: Normal range of motion.  Lymphadenopathy:       Head (right side): No preauricular and no posterior auricular adenopathy present.       Head (left side): No preauricular and no posterior auricular adenopathy present.    She has cervical adenopathy.  Neurological: She is alert and oriented to person, place, and time.  Skin: Skin is warm. No rash noted. She is diaphoretic.  Psychiatric: Her mood appears anxious.  Nursing note and vitals reviewed.    ED Treatments / Results  Labs (all labs ordered are listed, but only abnormal results are displayed) Labs Reviewed  COMPREHENSIVE METABOLIC PANEL - Abnormal; Notable for the following:       Result Value   Potassium 3.2 (*)    BUN <5 (*)    Creatinine, Ser 0.38 (*)    Calcium 8.8 (*)    Total Protein 6.4 (*)    Albumin 2.9 (*)    Alkaline Phosphatase 167 (*)    All other components within normal limits  CBC WITH DIFFERENTIAL/PLATELET - Abnormal; Notable for the following:    RBC 3.71 (*)    Hemoglobin 10.2 (*)    HCT 31.4 (*)    Monocytes Absolute 1.1 (*)    All other components within normal limits  URINALYSIS, ROUTINE W REFLEX MICROSCOPIC (NOT AT [facility]) - Abnormal; Notable for the following:    Ketones, ur 15 (*)    Leukocytes, UA SMALL (*)    All other components within normal limits  URINE MICROSCOPIC-ADD ON - Abnormal; Notable for the  following:    Squamous Epithelial / LPF 0-5 (*)    Bacteria, UA RARE (*)    All other components within normal limits  I-STAT CG4 LACTIC ACID, ED - Abnormal; Notable for the following:    Lactic Acid, Venous 0.48 (*)    All other components within normal limits  CULTURE, BLOOD (ROUTINE X 2)  CULTURE, BLOOD (ROUTINE X 2)  URINE CULTURE  MAGNESIUM  CARBAMAZEPINE LEVEL, TOTAL  I-STAT CG4 LACTIC ACID, ED    EKG  EKG Interpretation None       Radiology No results found.  Procedures Procedures (including critical care time)  Medications Ordered in ED Medications  vancomycin (VANCOCIN) IVPB 1000 mg/200 mL premix (not administered)  piperacillin-tazobactam (ZOSYN) IVPB 3.375 g (not administered)  0.9 % NaCl with KCl 40 mEq / L  infusion (not administered)  oxyCODONE (Oxy IR/ROXICODONE) immediate release tablet 5 mg (5 mg Oral Given 05/29/16 1849)  ondansetron (  ZOFRAN-ODT) disintegrating tablet 4 mg (4 mg Oral Given 05/29/16 1849)  HYDROmorphone (DILAUDID) injection 1 mg (1 mg Intravenous Given 05/29/16 2113)  ondansetron (ZOFRAN) injection 4 mg (4 mg Intravenous Given 05/29/16 2111)  sodium chloride 0.9 % bolus 1,000 mL (0 mLs Intravenous Stopped 05/29/16 2241)    And  sodium chloride 0.9 % bolus 1,000 mL (0 mLs Intravenous Stopped 05/29/16 2241)    And  sodium chloride 0.9 % bolus 500 mL (0 mLs Intravenous Stopped 05/29/16 2155)  piperacillin-tazobactam (ZOSYN) IVPB 3.375 g (0 g Intravenous Stopped 05/29/16 2200)  vancomycin (VANCOCIN) 1,500 mg in sodium chloride 0.9 % 500 mL IVPB (1,500 mg Intravenous New Bag/Given 05/29/16 2145)  LORazepam (ATIVAN) injection 0.5 mg (0.5 mg Intravenous Given 05/29/16 2249)     Initial Impression / Assessment and Plan / ED Course  I have reviewed the triage vital signs and the nursing notes.  Pertinent labs & imaging results that were available during my care of the patient were reviewed by me and considered in my medical decision making (see chart  for details).  Clinical Course   Patient tachyardic on arrival and appeared to be due to pain, while in department she developed tachypnea as well and now meets SIRS criteria with tachycardia, tachypnea and source of infection. Code Sepsis initiated @ 2041  CRITICAL CARE Performed by: Linus Mako Total critical care time: 45 minutes Critical care time was exclusive of separately billable procedures and treating other patients. Critical care was necessary to treat or prevent imminent or life-threatening deterioration. Critical care was time spent personally by me on the following activities: development of treatment plan with patient and/or surrogate as well as nursing, discussions with consultants, evaluation of patient's response to treatment, examination of patient, obtaining history from patient or surrogate, ordering and performing treatments and interventions, ordering and review of laboratory studies, ordering and review of radiographic studies, pulse oximetry and re-evaluation of patient's condition.  The etiology of the patients symptoms is unclear, however she continues to be tachycardic and tachypnic.  Admitted to [place], Dr. Olevia Bowens, inpatient, tele, [facility] admits  Final Clinical Impressions(s) / ED Diagnoses   Final diagnoses:  Dental infection  Chest pain, unspecified chest pain type  Fever, unspecified fever cause  Tachycardia    New Prescriptions New Prescriptions   No medications on file     Delos Haring, Hershal Coria 05/30/16 0039    Daleen Bo, MD 06/03/16 [phone]

## 2016-05-30 ENCOUNTER — Observation Stay (HOSPITAL_COMMUNITY): Payer: Medicaid Other

## 2016-05-30 DIAGNOSIS — F17200 Nicotine dependence, unspecified, uncomplicated: Secondary | ICD-10-CM | POA: Diagnosis present

## 2016-05-30 DIAGNOSIS — D571 Sickle-cell disease without crisis: Secondary | ICD-10-CM | POA: Diagnosis present

## 2016-05-30 DIAGNOSIS — Z91010 Allergy to peanuts: Secondary | ICD-10-CM | POA: Diagnosis not present

## 2016-05-30 DIAGNOSIS — R079 Chest pain, unspecified: Secondary | ICD-10-CM | POA: Diagnosis present

## 2016-05-30 DIAGNOSIS — E039 Hypothyroidism, unspecified: Secondary | ICD-10-CM | POA: Diagnosis present

## 2016-05-30 DIAGNOSIS — Z8249 Family history of ischemic heart disease and other diseases of the circulatory system: Secondary | ICD-10-CM | POA: Diagnosis not present

## 2016-05-30 DIAGNOSIS — R1013 Epigastric pain: Secondary | ICD-10-CM | POA: Diagnosis present

## 2016-05-30 DIAGNOSIS — R000 Tachycardia, unspecified: Secondary | ICD-10-CM | POA: Diagnosis present

## 2016-05-30 DIAGNOSIS — Z886 Allergy status to analgesic agent status: Secondary | ICD-10-CM | POA: Diagnosis not present

## 2016-05-30 DIAGNOSIS — R519 Headache, unspecified: Secondary | ICD-10-CM | POA: Diagnosis present

## 2016-05-30 DIAGNOSIS — E876 Hypokalemia: Secondary | ICD-10-CM | POA: Diagnosis present

## 2016-05-30 DIAGNOSIS — K029 Dental caries, unspecified: Secondary | ICD-10-CM | POA: Diagnosis present

## 2016-05-30 DIAGNOSIS — I119 Hypertensive heart disease without heart failure: Secondary | ICD-10-CM | POA: Diagnosis present

## 2016-05-30 DIAGNOSIS — G40909 Epilepsy, unspecified, not intractable, without status epilepticus: Secondary | ICD-10-CM | POA: Diagnosis present

## 2016-05-30 DIAGNOSIS — Z79899 Other long term (current) drug therapy: Secondary | ICD-10-CM | POA: Diagnosis not present

## 2016-05-30 DIAGNOSIS — R51 Headache: Secondary | ICD-10-CM

## 2016-05-30 DIAGNOSIS — Z91013 Allergy to seafood: Secondary | ICD-10-CM | POA: Diagnosis not present

## 2016-05-30 DIAGNOSIS — K047 Periapical abscess without sinus: Secondary | ICD-10-CM | POA: Diagnosis present

## 2016-05-30 LAB — CBC WITH DIFFERENTIAL/PLATELET
BASOS ABS: 0 10*3/uL (ref 0.0–0.1)
Basophils Relative: 0 %
EOS PCT: 2 %
Eosinophils Absolute: 0.1 10*3/uL (ref 0.0–0.7)
HCT: 30.4 % — ABNORMAL LOW (ref 36.0–46.0)
Hemoglobin: 10.1 g/dL — ABNORMAL LOW (ref 12.0–15.0)
LYMPHS PCT: 35 %
Lymphs Abs: 1.9 10*3/uL (ref 0.7–4.0)
MCH: 28.1 pg (ref 26.0–34.0)
MCHC: 33.2 g/dL (ref 30.0–36.0)
MCV: 84.4 fL (ref 78.0–100.0)
MONO ABS: 0.9 10*3/uL (ref 0.1–1.0)
Monocytes Relative: 16 %
Neutro Abs: 2.6 10*3/uL (ref 1.7–7.7)
Neutrophils Relative %: 47 %
PLATELETS: 188 10*3/uL (ref 150–400)
RBC: 3.6 MIL/uL — ABNORMAL LOW (ref 3.87–5.11)
RDW: 15.5 % (ref 11.5–15.5)
WBC: 5.6 10*3/uL (ref 4.0–10.5)

## 2016-05-30 LAB — RAPID URINE DRUG SCREEN, HOSP PERFORMED
Amphetamines: NOT DETECTED
BARBITURATES: NOT DETECTED
BENZODIAZEPINES: NOT DETECTED
Cocaine: NOT DETECTED
Opiates: NOT DETECTED
Tetrahydrocannabinol: NOT DETECTED

## 2016-05-30 LAB — TROPONIN I

## 2016-05-30 LAB — COMPREHENSIVE METABOLIC PANEL
ALT: 18 U/L (ref 14–54)
ANION GAP: 10 (ref 5–15)
AST: 26 U/L (ref 15–41)
Albumin: 2.7 g/dL — ABNORMAL LOW (ref 3.5–5.0)
Alkaline Phosphatase: 157 U/L — ABNORMAL HIGH (ref 38–126)
BUN: <5 mg/dL — ABNORMAL LOW (ref 6–20)
CHLORIDE: 108 mmol/L (ref 101–111)
CO2: 20 mmol/L — ABNORMAL LOW (ref 22–32)
Calcium: 8.7 mg/dL — ABNORMAL LOW (ref 8.9–10.3)
Creatinine, Ser: 0.34 mg/dL — ABNORMAL LOW (ref 0.44–1.00)
Glucose, Bld: 84 mg/dL (ref 65–99)
POTASSIUM: 3.3 mmol/L — AB (ref 3.5–5.1)
Sodium: 138 mmol/L (ref 135–145)
Total Bilirubin: 1.3 mg/dL — ABNORMAL HIGH (ref 0.3–1.2)
Total Protein: 6.2 g/dL — ABNORMAL LOW (ref 6.5–8.1)

## 2016-05-30 LAB — CARBAMAZEPINE LEVEL, TOTAL

## 2016-05-30 LAB — I-STAT CG4 LACTIC ACID, ED: LACTIC ACID, VENOUS: 0.48 mmol/L — AB (ref 0.5–1.9)

## 2016-05-30 LAB — HCG, QUANTITATIVE, PREGNANCY: HCG, BETA CHAIN, QUANT, S: 1 m[IU]/mL (ref <5)

## 2016-05-30 LAB — MAGNESIUM: MAGNESIUM: 1.5 mg/dL — AB (ref 1.7–2.4)

## 2016-05-30 MED ORDER — IOPAMIDOL (ISOVUE-370) INJECTION 76%
INTRAVENOUS | Status: AC
  Administered 2016-05-30: 100 mL
  Filled 2016-05-30: qty 100

## 2016-05-30 MED ORDER — SODIUM CHLORIDE 0.9% FLUSH
3.0000 mL | Freq: Two times a day (BID) | INTRAVENOUS | Status: DC
  Administered 2016-05-30 (×2): 3 mL via INTRAVENOUS

## 2016-05-30 MED ORDER — OXYCODONE HCL 5 MG PO TABS: 5.0000 mg | ORAL_TABLET | ORAL | Status: DC | PRN

## 2016-05-30 MED ORDER — ENOXAPARIN SODIUM 40 MG/0.4ML ~~LOC~~ SOLN
40.0000 mg | SUBCUTANEOUS | Status: DC
  Administered 2016-05-30 – 2016-05-31 (×2): 40 mg via SUBCUTANEOUS
  Filled 2016-05-30 (×2): qty 0.4

## 2016-05-30 MED ORDER — IOPAMIDOL (ISOVUE-300) INJECTION 61%
INTRAVENOUS | Status: AC
  Administered 2016-05-30: 100 mL
  Filled 2016-05-30: qty 100

## 2016-05-30 MED ORDER — OXYCODONE HCL 5 MG PO TABS
5.0000 mg | ORAL_TABLET | ORAL | Status: DC | PRN
  Administered 2016-05-30 (×2): 5 mg via ORAL
  Filled 2016-05-30 (×2): qty 1

## 2016-05-30 MED ORDER — POTASSIUM CHLORIDE IN NACL 40-0.9 MEQ/L-% IV SOLN
INTRAVENOUS | Status: DC
  Administered 2016-05-30 – 2016-05-31 (×2): 75 mL/h via INTRAVENOUS
  Filled 2016-05-30 (×2): qty 1000

## 2016-05-30 MED ORDER — POTASSIUM CHLORIDE 20 MEQ/15ML (10%) PO SOLN
40.0000 meq | Freq: Once | ORAL | Status: AC
  Administered 2016-05-30: 40 meq via ORAL
  Filled 2016-05-30: qty 30

## 2016-05-30 MED ORDER — MAGNESIUM SULFATE 4 GM/100ML IV SOLN
4.0000 g | Freq: Once | INTRAVENOUS | Status: AC
Start: 2016-05-30 — End: 2016-05-30
  Administered 2016-05-30: 4 g via INTRAVENOUS
  Filled 2016-05-30: qty 100

## 2016-05-30 MED ORDER — DIATRIZOATE MEGLUMINE & SODIUM 66-10 % PO SOLN
ORAL | Status: AC
Start: 2016-05-30 — End: 2016-05-30
  Administered 2016-05-30: 11:00:00
  Filled 2016-05-30: qty 30

## 2016-05-30 MED ORDER — ATENOLOL 25 MG PO TABS
25.0000 mg | ORAL_TABLET | Freq: Two times a day (BID) | ORAL | Status: DC
  Administered 2016-05-30 – 2016-05-31 (×3): 25 mg via ORAL
  Filled 2016-05-30 (×3): qty 1

## 2016-05-30 MED ORDER — CARBAMAZEPINE 200 MG PO TABS
400.0000 mg | ORAL_TABLET | Freq: Once | ORAL | Status: AC
  Administered 2016-05-30: 400 mg via ORAL
  Filled 2016-05-30: qty 2

## 2016-05-30 MED ORDER — CARBAMAZEPINE 200 MG PO TABS
400.0000 mg | ORAL_TABLET | Freq: Two times a day (BID) | ORAL | Status: DC
  Administered 2016-05-30 – 2016-05-31 (×3): 400 mg via ORAL
  Filled 2016-05-30 (×3): qty 2

## 2016-05-30 MED ORDER — ALUM & MAG HYDROXIDE-SIMETH 200-200-20 MG/5ML PO SUSP
30.0000 mL | Freq: Three times a day (TID) | ORAL | Status: AC
  Administered 2016-05-30 (×3): 30 mL via ORAL
  Filled 2016-05-30 (×3): qty 30

## 2016-05-30 MED ORDER — FAMOTIDINE 20 MG PO TABS
20.0000 mg | ORAL_TABLET | Freq: Two times a day (BID) | ORAL | Status: DC
Start: 2016-05-30 — End: 2016-05-31
  Administered 2016-05-30 – 2016-05-31 (×3): 20 mg via ORAL
  Filled 2016-05-30 (×3): qty 1

## 2016-05-30 MED ORDER — TRAMADOL HCL 50 MG PO TABS
50.0000 mg | ORAL_TABLET | Freq: Four times a day (QID) | ORAL | Status: DC | PRN
  Administered 2016-05-30: 50 mg via ORAL
  Filled 2016-05-30: qty 1

## 2016-05-30 NOTE — Progress Notes (Signed)
Arrival Method: Patient arrived in stretcher from ED. Mental Orientation: alert and oriented Telemetry:22 Assessment: See Doc Flow sheets. Skin: Warm, dry and intact. IV: Peripheral I Pain:.ongoing  Right face edema Fall Prevention Safety Plan: Patient educated about fall prevention safety plan, understood and acknowledged. Admission Screening:6700 Orientation: Patient has been oriented to the unit, staff and to the room.

## 2016-05-30 NOTE — Progress Notes (Signed)
PROGRESS NOTE                                                                                                                                                                                                             Patient Demographics:    Marie Parsons, is a 50 y.o. female, DOB - October 15, 1965, [id number]  Admit date - 05/29/2016   Admitting Physician Reubin Milan, MD  Outpatient Primary MD for the patient is Carmie Kanner, NP  LOS - 0  Chief Complaint  Patient presents with  . Dental Pain  . Palpitations       Brief Narrative    Marie Parsons is a 50 y.o. female with medical history significant of epilepsy, sickle cell anemia (no history of sickle cell crisis) who comes to the emergency department with a 2 day history of right-sided mandibular pain/swelling, palpitations and right-sided chest pain for 2 days. She denies any history of trauma to either area. She denies fever, chills, dyspnea, pitting edema of the lower extremities. She denies abdominal pain, diarrhea or constipation, but states that she had 3 episodes of nausea with emesis.   History was difficult to obtain as the patient frequently complains of pain, requests analgesic medications, but falls asleep easily.  ED Course: The was initially tachycardic and febrile. She received IV fluids, IV antibiotics, analgesics and anxiolytics reporting minimal relief. However, she dozes off easily during interview and exam.  The patient had a disheveled appearance. Workup was significant for anemia at 10.2 g/dL and albumin was 2.9 g/dL.    Subjective:    Marie Parsons today has, No headache, No chest pain, Mild epigastric abdominal pain - No Nausea, No new weakness tingling or numbness, No Cough - SOB. Some right-sided facial pain.   Assessment  & Plan :     1.Right lower pre-molar and molar infection. Possible early abscess. Continue empiric  antibiotics, follow cultures, detailed message left for Dentist on call - Dr Junita Push at [phone] left detailed message and call back number on 05-30-16 10am, also [phone] x 3 - no response.  2. NoN specific chest/epigastric pain. Epigastric pain is reproducible, initial EKG nonacute, CT angiogram stable, will repeat EKG, cycle troponin, check echocardiogram to evaluate wall motion and EF, empirically give GI cocktail and monitor.  3. History of smoking. Counseled to quit  4. History of seizures. On carbamazepine.  5. Hypertension. On beta blocker.  6. Hypokalemia and hypomagnesemia. Replaced will monitor.    Family Communication  :  None  Code Status :  Full  Diet : Clears  Disposition Plan  :  Stay Inpt  Consults  :  Called Dentist on call Dr Junita Push at [phone] left detailed message and call back number on 05-30-16 10am, also [phone] x 3 - no response   Procedures  :    VQ - stable Maxillofacial CT and on to pentagram possible right-sided dental caries with infection and early abscess Echogram  DVT Prophylaxis  :  Lovenox    Lab Results  Component Value Date   PLT 188 05/30/2016    Inpatient Medications  Scheduled Meds: . alum & mag hydroxide-simeth  30 mL Oral TID  . atenolol  25 mg Oral BID  . carbamazepine  400 mg Oral BID  . enoxaparin (LOVENOX) injection  40 mg Subcutaneous Q24H  . famotidine  20 mg Oral BID  . piperacillin-tazobactam (ZOSYN)  IV  3.375 g Intravenous Q8H  . potassium chloride  40 mEq Oral Once  . sodium chloride flush  3 mL Intravenous Q12H  . vancomycin  1,000 mg Intravenous Q12H   Continuous Infusions: . 0.9 % NaCl with KCl 40 mEq / L 100 mL/hr (05/30/16 0347)   PRN Meds:.oxyCODONE, oxyCODONE, traMADol  Antibiotics  :    Anti-infectives    Start     Dose/Rate Route Frequency Ordered Stop   05/30/16 1000  vancomycin (VANCOCIN) IVPB 1000 mg/200 mL premix     1,000 mg 200 mL/hr over 60 Minutes Intravenous Every 12  hours 05/29/16 2204     05/30/16 0400  piperacillin-tazobactam (ZOSYN) IVPB 3.375 g     3.375 g 12.5 mL/hr over 240 Minutes Intravenous Every 8 hours 05/29/16 2205     05/29/16 2215  piperacillin-tazobactam (ZOSYN) IVPB 3.375 g  Status:  Discontinued     3.375 g 12.5 mL/hr over 240 Minutes Intravenous Every 8 hours 05/29/16 2204 05/29/16 2205   05/29/16 2130  vancomycin (VANCOCIN) 1,500 mg in sodium chloride 0.9 % 500 mL IVPB     1,500 mg 250 mL/hr over 120 Minutes Intravenous  Once 05/29/16 2059 05/29/16 2345   05/29/16 2045  piperacillin-tazobactam (ZOSYN) IVPB 3.375 g     3.375 g 100 mL/hr over 30 Minutes Intravenous  Once 05/29/16 2042 05/29/16 2200   05/29/16 2045  vancomycin (VANCOCIN) IVPB 1000 mg/200 mL premix  Status:  Discontinued     1,000 mg 200 mL/hr over 60 Minutes Intravenous  Once 05/29/16 2042 05/29/16 2059         Objective:   Vitals:   05/30/16 0000 05/30/16 0212 05/30/16 0549 05/30/16 0850  BP: 108/62 124/71 116/60 (!) 100/48  Pulse: 109 (!) 101 98 100  Resp: (!) 29 (!) 29 (!) 25 (!) 22  Temp:  99 F (37.2 C) 98 F (36.7 C) 98.3 F (36.8 C)  TempSrc:  Oral Oral Oral  SpO2: 94% 90% 93% 95%  Weight:  80.8 kg (178 lb 2.1 oz)    Height:  5\' 4"  (1.626 m)      Wt Readings from Last 3 Encounters:  05/30/16 80.8 kg (178 lb 2.1 oz)  12/07/14 79.8 kg (176 lb)     Intake/Output Summary (Last 24 hours) at 05/30/16 1000 Last data filed at 05/30/16 0550  Gross per 24 hour  Intake  2950 ml  Output              900 ml  Net             2050 ml     Physical Exam  Awake Alert, Oriented X 3, No new F.N deficits, Normal affect [place].AT,PERRAL, R jaw swollen Supple Neck,No JVD, No cervical lymphadenopathy appriciated.  Symmetrical Chest wall movement, Good air movement bilaterally, CTAB RRR,No Gallops,Rubs or new Murmurs, No Parasternal Heave +ve B.Sounds, Abd Soft, No tenderness, No organomegaly appriciated, No rebound - guarding or rigidity. No  Cyanosis, Clubbing or edema, No new Rash or bruise       Data Review:    CBC  Recent Labs Lab 05/29/16 2106 05/30/16 0619  WBC 6.7 5.6  HGB 10.2* 10.1*  HCT 31.4* 30.4*  PLT 195 188  MCV 84.6 84.4  MCH 27.5 28.1  MCHC 32.5 33.2  RDW 15.1 15.5  LYMPHSABS 1.7 1.9  MONOABS 1.1* 0.9  EOSABS 0.1 0.1  BASOSABS 0.0 0.0    Chemistries   Recent Labs Lab 05/29/16 2106 05/30/16 0145 05/30/16 0619  NA 136  --  138  K 3.2*  --  3.3*  CL 104  --  108  CO2 24  --  20*  GLUCOSE 90  --  84  BUN <5*  --  <5*  CREATININE 0.38*  --  0.34*  CALCIUM 8.8*  --  8.7*  MG  --  1.5*  --   AST 26  --  26  ALT 20  --  18  ALKPHOS 167*  --  157*  BILITOT 0.9  --  1.3*   ------------------------------------------------------------------------------------------------------------------ No results for input(s): CHOL, HDL, LDLCALC, TRIG, CHOLHDL, LDLDIRECT in the last 72 hours.  Lab Results  Component Value Date   HGBA1C  08/31/2010    5.4 (NOTE)                                                                       According to the ADA Clinical Practice Recommendations for 2011, when HbA1c is used as a screening test:   >=6.5%   Diagnostic of Diabetes Mellitus           (if abnormal result  is confirmed)  5.7-6.4%   Increased risk of developing Diabetes Mellitus  References:Diagnosis and Classification of Diabetes Mellitus,Diabetes Care,2011,34(Suppl 1):S62-S69 and Standards of Medical Care in         Diabetes - 2011,Diabetes [id number]  (Suppl 1):S11-S61.   ------------------------------------------------------------------------------------------------------------------ No results for input(s): TSH, T4TOTAL, T3FREE, THYROIDAB in the last 72 hours.  Invalid input(s): FREET3 ------------------------------------------------------------------------------------------------------------------ No results for input(s): VITAMINB12, FOLATE, FERRITIN, TIBC, IRON, RETICCTPCT in the last 72  hours.  Coagulation profile No results for input(s): INR, PROTIME in the last 168 hours.  No results for input(s): DDIMER in the last 72 hours.  Cardiac Enzymes No results for input(s): CKMB, TROPONINI, MYOGLOBIN in the last 168 hours.  Invalid input(s): CK ------------------------------------------------------------------------------------------------------------------ No results found for: BNP  Micro Results No results found for this or any previous visit (from the past 240 hour(s)).  Radiology Reports Dg Orthopantogram  Result Date: 05/30/2016 CLINICAL DATA:  Pain on the right EXAM: ORTHOPANTOGRAM/PANORAMIC COMPARISON:  None. FINDINGS: Panoramic view obtained.  There are multiple missing teeth. Multiple areas of dental caries noted. There is lucency surrounding the roots of the right lower premolar and molar. No acute fracture or dislocation evident. IMPRESSION: Lucency surrounding the roots of the right lower premolar and molar. Early abscess formation in these areas must be of concern. There is multifocal dental caries as well as multiple teeth missing. No fracture or dislocation. Electronically Signed   By: Lowella Grip III M.D.   On: 05/30/2016 08:59   Ct Angio Chest Pe W Or Wo Contrast  Result Date: 05/30/2016 CLINICAL DATA:  Hypoxia and tachycardia. EXAM: CT ANGIOGRAPHY CHEST WITH CONTRAST TECHNIQUE: Multidetector CT imaging of the chest was performed using the standard protocol during bolus administration of intravenous contrast. Multiplanar CT image reconstructions and MIPs were obtained to evaluate the vascular anatomy. CONTRAST:  100 cc Isovue 370 intravenous COMPARISON:  None available FINDINGS: Cardiovascular: Study optimized for the pulmonary arteries. No filling defect to suggest pulmonary embolism. No evidence of acute aortic syndrome. Borderline cardiomegaly. No pericardial effusion. Atherosclerotic calcifications seen along the aorta and coronaries. Mediastinum:  Symmetric, partly seen goiter. Mild nonspecific enlargement of hilar and mediastinal lymph nodes. Hilar lymph nodes measure up to 1 cm. Lungs/Pleura: Expiratory phase with dependent atelectasis. Diffuse airway thickening. Centrilobular emphysema. Bilateral septal lines may be atelectatic; no definitive pulmonary edema Upper abdomen: No acute findings. Replaced right hepatic artery to the SMA. Musculoskeletal: Exaggerated thoracic kyphosis. No acute or aggressive finding. Review of the MIP images confirms the above findings. IMPRESSION: 1. No evidence of pulmonary embolism. 2.  Emphysema. (ICD10-J43.9) and airway thickening. 3. Mild nonspecific mediastinal and hilar adenopathy, potentially related to #2. Correlate with labs. 4. Atherosclerosis, including the coronary arteries. 5. Goiter. Electronically Signed   By: Monte Fantasia M.D.   On: 05/30/2016 01:44   Ct Maxillofacial Wo Contrast  Result Date: 05/30/2016 CLINICAL DATA:  Acute facial pain, reportedly dental pain on the right EXAM: CT MAXILLOFACIAL WITHOUT CONTRAST TECHNIQUE: Multidetector CT imaging of the maxillofacial structures was performed. Multiplanar CT image reconstructions were also generated. A small metallic BB was placed on the right temple in order to reliably differentiate right from left. COMPARISON:  None. FINDINGS: There are cavities throughout the bilateral maxillary and mandibular teeth, worse in the mandible where there are large periapical erosions around the right-sided remaining molars, right canine, and bilateral first premolars. Notable given the acute inflammation, there is also periapical erosion of the right upper terminal molar. Asymmetric inflammation around the right face, seen in the buccal fat pad and tracking along the lower posterior right maxillary sinus. Given reported dental pain this is presumably active odontogenic infection. There is right maxillary sinusitis, but chronic appearing and without periantral fat edema  to suggest invasive sinusitis. Mucosal thickening is greatest on the floor of the right maxillary sinus where there is likely large mucous retention cyst. No evidence of floor mouth swelling or airway compromise. Mild enlargement of cervical lymph nodes which may be reactive in this setting. Polyp or retention cyst in the inferior right frontal sinus, nonobstructive. Negative orbital and partial intracranial imaging. IMPRESSION: Right face inflammation/infection, favored odontogenic. No visible abscess, but limited without IV contrast. Electronically Signed   By: Monte Fantasia M.D.   On: 05/30/2016 01:32    Time Spent in minutes  30   Jahmel Flannagan K M.D on 05/30/2016 at 10:00 AM  Between 7am to 7pm - Pager - [phone]  After 7pm go to [email or web address] - password [email or web address]  [facility] -  Office  [phone]

## 2016-05-31 LAB — CBC
HEMATOCRIT: 33.6 % — AB (ref 36.0–46.0)
Hemoglobin: 10.8 g/dL — ABNORMAL LOW (ref 12.0–15.0)
MCH: 27.9 pg (ref 26.0–34.0)
MCHC: 32.1 g/dL (ref 30.0–36.0)
MCV: 86.8 fL (ref 78.0–100.0)
PLATELETS: 187 10*3/uL (ref 150–400)
RBC: 3.87 MIL/uL (ref 3.87–5.11)
RDW: 15.5 % (ref 11.5–15.5)
WBC: 3.5 10*3/uL — ABNORMAL LOW (ref 4.0–10.5)

## 2016-05-31 LAB — BASIC METABOLIC PANEL WITH GFR
Anion gap: 10 (ref 5–15)
BUN: <5 mg/dL — ABNORMAL LOW (ref 6–20)
CO2: 21 mmol/L — ABNORMAL LOW (ref 22–32)
Calcium: 9.5 mg/dL (ref 8.9–10.3)
Chloride: 109 mmol/L (ref 101–111)
Creatinine, Ser: 0.37 mg/dL — ABNORMAL LOW (ref 0.44–1.00)
GFR calc Af Amer: >60 mL/min
GFR calc non Af Amer: >60 mL/min
Glucose, Bld: 88 mg/dL (ref 65–99)
Potassium: 4.3 mmol/L (ref 3.5–5.1)
Sodium: 140 mmol/L (ref 135–145)

## 2016-05-31 LAB — URINE CULTURE

## 2016-05-31 LAB — MAGNESIUM: Magnesium: 1.7 mg/dL (ref 1.7–2.4)

## 2016-05-31 MED ORDER — METRONIDAZOLE 500 MG PO TABS: 500.0000 mg | ORAL_TABLET | Freq: Three times a day (TID) | ORAL | 0 refills | Status: DC

## 2016-05-31 MED ORDER — CLINDAMYCIN HCL 300 MG PO CAPS
300.0000 mg | ORAL_CAPSULE | Freq: Four times a day (QID) | ORAL | Status: DC
  Administered 2016-05-31: 300 mg via ORAL
  Filled 2016-05-31: qty 1

## 2016-05-31 MED ORDER — CLINDAMYCIN HCL 300 MG PO CAPS: 600.0000 mg | ORAL_CAPSULE | Freq: Four times a day (QID) | ORAL | 0 refills | Status: DC

## 2016-05-31 MED ORDER — ATENOLOL 50 MG PO TABS: 50.0000 mg | ORAL_TABLET | Freq: Two times a day (BID) | ORAL | 0 refills | Status: DC

## 2016-05-31 MED ORDER — OXYCODONE HCL 5 MG PO TABS: 5.0000 mg | ORAL_TABLET | Freq: Four times a day (QID) | ORAL | 0 refills | Status: DC | PRN

## 2016-05-31 NOTE — Discharge Summary (Signed)
Marie Parsons [id number] DOB: [birth year] DOA: 05/29/2016  PCP: Carmie Kanner, NP  Admit date: 05/29/2016  Discharge date: 05/31/2016  Admitted From: Home   Disposition:  Home   Recommendations for Outpatient Follow-up:   Follow up with PCP in 1-2 weeks  PCP Please obtain BMP/CBC, 2 view CXR in 1week,  (see Discharge instructions)   PCP Please follow up on the following pending results: Review CT scan results below and follow as needed outpatient.   Home Health: None   Equipment/Devices: None  Consultations: Dentist over the phone Discharge Condition: Stable   CODE STATUS: Full   Diet Recommendation: Soft   Chief Complaint  Patient presents with  . Dental Pain  . Palpitations     Brief history of present illness from the day of admission and additional interim summary    Marie Parsons a 50 y.o.femalewith medical history significant of epilepsy, sickle cell anemia (no history of sickle cell crisis) who comes to the emergency department with a 2 day history of right-sided mandibular pain/swelling, palpitations and right-sided chest pain for 2 days. She denies any history of trauma to either area. She denies fever, chills, dyspnea, pitting edema of the lower extremities. She denies abdominal pain, diarrhea or constipation, but states that she had 3 episodes of nausea with emesis.   History was difficult to obtain as the patient frequently complains of pain, requests analgesic medications, but falls asleep easily.  ED Course:The was initially tachycardic and febrile. She received IV fluids, IV antibiotics, analgesics and anxiolytics reporting minimal relief. However, she dozes off easily during interview and exam. The patient had a disheveled appearance. Workup was significant for anemia at 10.2  g/dL and albumin was 2.9 g/dL  Hospital issues addressed       1.Right lower pre-molar and molar infection. Possible early abscess. Was placed on empiric IV antibiotics with good results, swelling and pain has much subsided, will be placed on 1 week of oral clindamycin and Flagyl, had detailed discussion with Dentist on call - Dr Junita Push on 05/30/2016 who recommended the above plan with follow-up with him in 5-7 days in his office.  2. NoN specific chest/epigastric pain. All resolved with GI cocktail, patient had various weight complains yesterday, underwent CT angiogram of the chest and abdomen pelvis both of which were nonacute, EKG nonacute and troponin negative 3. This morning she is completely symptom free and wants to be discharged ASAP. We'll have her follow with PCP.  3. History of smoking. Counseled to quit  4. History of seizures. On carbamazepine.  5. Hypertension. On beta blocker.  6. Hypokalemia and hypomagnesemia. Replaced will monitor.  7. Nonspecific CT chest findings. Request PCP to review the results and do outpatient follow-up as clinically appropriate.   Discharge diagnosis     Principal Problem:   Fever Active Problems:   Seizure disorder (HCC)   Tachycardia   Hypokalemia   Acute facial pain   Right-sided chest pain   Hypomagnesemia    Discharge instructions    Discharge  Instructions    Discharge instructions    Complete by:  As directed   Follow with Primary MD PLACEY,MARY H, NP in 3 days   Get CBC, CMP, 2 view Chest X ray checked  by Primary MD or SNF MD in 5-7 days ( we routinely change or add medications that can affect your baseline labs and fluid status, therefore we recommend that you get the mentioned basic workup next visit with your PCP, your PCP may decide not to get them or add new tests based on their clinical decision)   Activity: As tolerated with Full fall precautions use walker/cane & assistance as needed   Disposition Home       Diet:   Soft.  For Heart failure patients - Check your Weight same time everyday, if you gain over 2 pounds, or you develop in leg swelling, experience more shortness of breath or chest pain, call your Primary MD immediately. Follow Cardiac Low Salt Diet and 1.5 lit/day fluid restriction.   On your next visit with your primary care physician please Get Medicines reviewed and adjusted.   Please request your Prim.MD to go over all Hospital Tests and Procedure/Radiological results at the follow up, please get all Hospital records sent to your Prim MD by signing hospital release before you go home.   If you experience worsening of your admission symptoms, develop shortness of breath, life threatening emergency, suicidal or homicidal thoughts you must seek medical attention immediately by calling 911 or calling your MD immediately  if symptoms less severe.  You Must read complete instructions/literature along with all the possible adverse reactions/side effects for all the Medicines you take and that have been prescribed to you. Take any new Medicines after you have completely understood and accpet all the possible adverse reactions/side effects.   Do not drive, operate heavy machinery, perform activities at heights, swimming or participation in water activities or provide baby sitting services if your were admitted for syncope or siezures until you have seen by Primary MD or a Neurologist and advised to do so again.  Do not drive when taking Pain medications.    Do not take more than prescribed Pain, Sleep and Anxiety Medications  Special Instructions: If you have smoked or chewed Tobacco  in the last 2 yrs please stop smoking, stop any regular Alcohol  and or any Recreational drug use.  Wear Seat belts while driving.   Please note  You were cared for by a hospitalist during your hospital stay. If you have any questions about your discharge medications or the care you received while  you were in the hospital after you are discharged, you can call the unit and asked to speak with the hospitalist on call if the hospitalist that took care of you is not available. Once you are discharged, your primary care physician will handle any further medical issues. Please note that NO REFILLS for any discharge medications will be authorized once you are discharged, as it is imperative that you return to your primary care physician (or establish a relationship with a primary care physician if you do not have one) for your aftercare needs so that they can reassess your need for medications and monitor your lab values.   Increase activity slowly    Complete by:  As directed      Discharge Medications     Medication List    TAKE these medications   atenolol 50 MG tablet Commonly known as:  TENORMIN Take 1  tablet (50 mg total) by mouth 2 (two) times daily. 0900 & 1700 What changed:  medication strength  how much to take   carbamazepine 200 MG tablet Commonly known as:  TEGRETOL Take 400 mg by mouth 2 (two) times daily.   clindamycin 300 MG capsule Commonly known as:  CLEOCIN Take 2 capsules (600 mg total) by mouth every 6 (six) hours.   metroNIDAZOLE 500 MG tablet Commonly known as:  FLAGYL Take 1 tablet (500 mg total) by mouth 3 (three) times daily.   OVER THE COUNTER MEDICATION Apply 1 application topically 2 (two) times daily. OTC [facility] Rash Ointment   oxyCODONE 5 MG immediate release tablet Commonly known as:  Oxy IR/ROXICODONE Take 1 tablet (5 mg total) by mouth every 6 (six) hours as needed for severe pain.       Follow-up Information    PLACEY,MARY H, NP. Schedule an appointment as soon as possible for a visit in 3 day(s).   Contact information: [place] [address] [phone]        LUIS M BENITEZ, DMD. Schedule an appointment as soon as possible for a visit in 3 day(s).   Specialty:  Dentistry Contact information: [place] [address] [phone]        Candee Furbish, MD. Schedule an appointment as soon as possible for a visit in 1 week(s).   Specialty:  Cardiology Contact information: [id number] [place] [address] [phone]           Major procedures and Radiology Reports - PLEASE review detailed and final reports thoroughly  -        Dg Orthopantogram  Result Date: 05/30/2016 CLINICAL DATA:  Pain on the right EXAM: ORTHOPANTOGRAM/PANORAMIC COMPARISON:  None. FINDINGS: Panoramic view obtained. There are multiple missing teeth. Multiple areas of dental caries noted. There is lucency surrounding the roots of the right lower premolar and molar. No acute fracture or dislocation evident. IMPRESSION: Lucency surrounding the roots of the right lower premolar and molar. Early abscess formation in these areas must be of concern. There is multifocal dental caries as well as multiple teeth missing. No fracture or dislocation. Electronically Signed   By: Lowella Grip III M.D.   On: 05/30/2016 08:59   Ct Angio Chest Pe W Or Wo Contrast  Result Date: 05/30/2016 CLINICAL DATA:  Hypoxia and tachycardia. EXAM: CT ANGIOGRAPHY CHEST WITH CONTRAST TECHNIQUE: Multidetector CT imaging of the chest was performed using the standard protocol during bolus administration of intravenous contrast. Multiplanar CT image reconstructions and MIPs were obtained to evaluate the vascular anatomy. CONTRAST:  100 cc Isovue 370 intravenous COMPARISON:  None available FINDINGS: Cardiovascular: Study optimized for the pulmonary arteries. No filling defect to suggest pulmonary embolism. No evidence of acute aortic syndrome. Borderline cardiomegaly. No pericardial effusion. Atherosclerotic calcifications seen along the aorta and coronaries. Mediastinum: Symmetric, partly seen goiter. Mild nonspecific enlargement of hilar and mediastinal lymph nodes. Hilar lymph nodes measure up to 1  cm. Lungs/Pleura: Expiratory phase with dependent atelectasis. Diffuse airway thickening. Centrilobular emphysema. Bilateral septal lines may be atelectatic; no definitive pulmonary edema Upper abdomen: No acute findings. Replaced right hepatic artery to the SMA. Musculoskeletal: Exaggerated thoracic kyphosis. No acute or aggressive finding. Review of the MIP images confirms the above findings. IMPRESSION: 1. No evidence of pulmonary embolism. 2.  Emphysema. (ICD10-J43.9) and airway thickening. 3. Mild nonspecific mediastinal and hilar adenopathy, potentially related to #2. Correlate with  labs. 4. Atherosclerosis, including the coronary arteries. 5. Goiter. Electronically Signed   By: Monte Fantasia M.D.   On: 05/30/2016 01:44   Ct Abdomen Pelvis W Contrast  Result Date: 05/30/2016 CLINICAL DATA:  Sickle cell. Two day history of right side chest pain and mandibular pain. Nausea, vomiting. EXAM: CT ABDOMEN AND PELVIS WITH CONTRAST TECHNIQUE: Multidetector CT imaging of the abdomen and pelvis was performed using the standard protocol following bolus administration of intravenous contrast. CONTRAST:  1101mL ISOVUE-300 IOPAMIDOL (ISOVUE-300) INJECTION 61% COMPARISON:  None. FINDINGS: Lower chest: Mild cardiomegaly. Dependent bibasilar atelectasis. No effusions. Hepatobiliary: No focal hepatic abnormality. Gallbladder unremarkable. Pancreas: No focal abnormality or ductal dilatation. Spleen: No focal abnormality.  Normal size. Adrenals/Urinary Tract: No adrenal abnormality. No focal renal abnormality. No stones or hydronephrosis. Urinary bladder is unremarkable. Stomach/Bowel: Appendix is normal. Stomach, large and small bowel grossly unremarkable. Vascular/Lymphatic: Scattered aortic and iliac calcifications. No aneurysm. No adenopathy. Reproductive: Uterus and adnexa unremarkable.  No mass. Other: No free fluid or free air. Locules of gas in the left lateral lower abdominal subcutaneous soft tissues, presumably  related to subcutaneous injections. Recommend clinical correlation. Musculoskeletal: No acute bony abnormality or focal bone lesion. IMPRESSION: Normal appendix.  No acute intra-abdominal abnormality. Cardiomegaly, bibasilar atelectasis. Gas within the left lateral lower abdominal wall, presumably related to subcutaneous injections. Recommend clinical correlation. Electronically Signed   By: Rolm Baptise M.D.   On: 05/30/2016 14:32   Ct Maxillofacial Wo Contrast  Result Date: 05/30/2016 CLINICAL DATA:  Acute facial pain, reportedly dental pain on the right EXAM: CT MAXILLOFACIAL WITHOUT CONTRAST TECHNIQUE: Multidetector CT imaging of the maxillofacial structures was performed. Multiplanar CT image reconstructions were also generated. A small metallic BB was placed on the right temple in order to reliably differentiate right from left. COMPARISON:  None. FINDINGS: There are cavities throughout the bilateral maxillary and mandibular teeth, worse in the mandible where there are large periapical erosions around the right-sided remaining molars, right canine, and bilateral first premolars. Notable given the acute inflammation, there is also periapical erosion of the right upper terminal molar. Asymmetric inflammation around the right face, seen in the buccal fat pad and tracking along the lower posterior right maxillary sinus. Given reported dental pain this is presumably active odontogenic infection. There is right maxillary sinusitis, but chronic appearing and without periantral fat edema to suggest invasive sinusitis. Mucosal thickening is greatest on the floor of the right maxillary sinus where there is likely large mucous retention cyst. No evidence of floor mouth swelling or airway compromise. Mild enlargement of cervical lymph nodes which may be reactive in this setting. Polyp or retention cyst in the inferior right frontal sinus, nonobstructive. Negative orbital and partial intracranial imaging. IMPRESSION:  Right face inflammation/infection, favored odontogenic. No visible abscess, but limited without IV contrast. Electronically Signed   By: Monte Fantasia M.D.   On: 05/30/2016 01:32    Micro Results    Recent Results (from the past 240 hour(s))  Blood Culture (routine x 2)     Status: None (Preliminary result)   Collection Time: 05/29/16  8:54 PM  Result Value Ref Range Status   Specimen Description BLOOD LEFT ANTECUBITAL  Final   Special Requests BOTTLES DRAWN AEROBIC AND ANAEROBIC 5CC  Final   Culture NO GROWTH < 24 HOURS  Final   Report Status PENDING  Incomplete  Blood Culture (routine x 2)     Status: None (Preliminary result)   Collection Time: 05/29/16  9:06 PM  Result  Value Ref Range Status   Specimen Description BLOOD RIGHT FOREARM  Final   Special Requests BOTTLES DRAWN AEROBIC AND ANAEROBIC 5CC  Final   Culture NO GROWTH < 24 HOURS  Final   Report Status PENDING  Incomplete  Urine culture     Status: Abnormal   Collection Time: 05/29/16  9:27 PM  Result Value Ref Range Status   Specimen Description URINE, RANDOM  Final   Special Requests NONE  Final   Culture MULTIPLE SPECIES PRESENT, SUGGEST RECOLLECTION (A)  Final   Report Status 05/31/2016 FINAL  Final    Today   Subjective    Marvelyn Trott today has no headache,no chest abdominal pain,no new weakness tingling or numbness, feels much better wants to go home today.    Objective   Blood pressure 142/76, pulse 91, temperature 99.2 F (37.3 C), temperature source Oral, resp. rate 18, height 5\' 4"  (1.626 m), weight 80.8 kg (178 lb 2.1 oz), last menstrual period 06/22/2014, SpO2 92 %.   Intake/Output Summary (Last 24 hours) at 05/31/16 0855 Last data filed at 05/31/16 0644  Gross per 24 hour  Intake           3267.5 ml  Output              600 ml  Net           2667.5 ml    Exam Awake Alert, Oriented x 3, No new F.N deficits, Normal affect [place].AT,PERRAL, mild R facial swelling Supple Neck,No JVD, No  cervical lymphadenopathy appriciated.  Symmetrical Chest wall movement, Good air movement bilaterally, CTAB RRR,No Gallops,Rubs or new Murmurs, No Parasternal Heave +ve B.Sounds, Abd Soft, Non tender, No organomegaly appriciated, No rebound -guarding or rigidity. No Cyanosis, Clubbing or edema, No new Rash or bruise   Data Review   CBC w Diff: Lab Results  Component Value Date   WBC 3.5 (L) 05/31/2016   HGB 10.8 (L) 05/31/2016   HCT 33.6 (L) 05/31/2016   PLT 187 05/31/2016   LYMPHOPCT 35 05/30/2016   MONOPCT 16 05/30/2016   EOSPCT 2 05/30/2016   BASOPCT 0 05/30/2016    CMP: Lab Results  Component Value Date   NA 140 05/31/2016   K 4.3 05/31/2016   CL 109 05/31/2016   CO2 21 (L) 05/31/2016   BUN <5 (L) 05/31/2016   CREATININE 0.37 (L) 05/31/2016   PROT 6.2 (L) 05/30/2016   ALBUMIN 2.7 (L) 05/30/2016   BILITOT 1.3 (H) 05/30/2016   ALKPHOS 157 (H) 05/30/2016   AST 26 05/30/2016   ALT 18 05/30/2016  .   Total Time in preparing paper work, data evaluation and todays exam - 35 minutes  Thurnell Lose M.D on 05/31/2016 at 8:55 AM  [facility]   Office  [phone]

## 2016-05-31 NOTE — Discharge Instructions (Signed)
Follow with Primary MD PLACEY,MARY H, NP in 3 days   Get CBC, CMP, 2 view Chest X ray checked  by Primary MD or SNF MD in 5-7 days ( we routinely change or add medications that can affect your baseline labs and fluid status, therefore we recommend that you get the mentioned basic workup next visit with your PCP, your PCP may decide not to get them or add new tests based on their clinical decision)   Activity: As tolerated with Full fall precautions use walker/cane & assistance as needed   Disposition Home     Diet:   Soft.  For Heart failure patients - Check your Weight same time everyday, if you gain over 2 pounds, or you develop in leg swelling, experience more shortness of breath or chest pain, call your Primary MD immediately. Follow Cardiac Low Salt Diet and 1.5 lit/day fluid restriction.   On your next visit with your primary care physician please Get Medicines reviewed and adjusted.   Please request your Prim.MD to go over all Hospital Tests and Procedure/Radiological results at the follow up, please get all Hospital records sent to your Prim MD by signing hospital release before you go home.   If you experience worsening of your admission symptoms, develop shortness of breath, life threatening emergency, suicidal or homicidal thoughts you must seek medical attention immediately by calling 911 or calling your MD immediately  if symptoms less severe.  You Must read complete instructions/literature along with all the possible adverse reactions/side effects for all the Medicines you take and that have been prescribed to you. Take any new Medicines after you have completely understood and accpet all the possible adverse reactions/side effects.   Do not drive, operate heavy machinery, perform activities at heights, swimming or participation in water activities or provide baby sitting services if your were admitted for syncope or siezures until you have seen by Primary MD or a Neurologist  and advised to do so again.  Do not drive when taking Pain medications.    Do not take more than prescribed Pain, Sleep and Anxiety Medications  Special Instructions: If you have smoked or chewed Tobacco  in the last 2 yrs please stop smoking, stop any regular Alcohol  and or any Recreational drug use.  Wear Seat belts while driving.   Please note  You were cared for by a hospitalist during your hospital stay. If you have any questions about your discharge medications or the care you received while you were in the hospital after you are discharged, you can call the unit and asked to speak with the hospitalist on call if the hospitalist that took care of you is not available. Once you are discharged, your primary care physician will handle any further medical issues. Please note that NO REFILLS for any discharge medications will be authorized once you are discharged, as it is imperative that you return to your primary care physician (or establish a relationship with a primary care physician if you do not have one) for your aftercare needs so that they can reassess your need for medications and monitor your lab values.

## 2016-05-31 NOTE — Progress Notes (Signed)
Marie Parsons to be D/C'd Home per MD order.  Discussed prescriptions and follow up appointments with the patient. Prescriptions given to patient, medication list explained in detail. Pt verbalized understanding.    Medication List    TAKE these medications   atenolol 50 MG tablet Commonly known as:  TENORMIN Take 1 tablet (50 mg total) by mouth 2 (two) times daily. 0900 & 1700 What changed:  medication strength  how much to take   carbamazepine 200 MG tablet Commonly known as:  TEGRETOL Take 400 mg by mouth 2 (two) times daily.   clindamycin 300 MG capsule Commonly known as:  CLEOCIN Take 2 capsules (600 mg total) by mouth every 6 (six) hours.   metroNIDAZOLE 500 MG tablet Commonly known as:  FLAGYL Take 1 tablet (500 mg total) by mouth 3 (three) times daily.   OVER THE COUNTER MEDICATION Apply 1 application topically 2 (two) times daily. OTC [facility] Rash Ointment   oxyCODONE 5 MG immediate release tablet Commonly known as:  Oxy IR/ROXICODONE Take 1 tablet (5 mg total) by mouth every 6 (six) hours as needed for severe pain.       Vitals:   05/31/16 0445 05/31/16 0749  BP: 121/67 (!) 145/77  Pulse: 96 (!) 106  Resp: 20 18  Temp: 98.2 F (36.8 C) 99.2 F (37.3 C)    Skin clean, dry and intact without evidence of skin break down, no evidence of skin tears noted. IV catheter discontinued intact. Site without signs and symptoms of complications. Dressing and pressure applied. Pt denies pain at this time. No complaints noted.  An After Visit Summary was printed and given to the patient. Patient escorted via [place], and D/C home via private auto.  [name] 05/31/2016 10:54 AM

## 2016-06-03 LAB — CULTURE, BLOOD (ROUTINE X 2)
CULTURE: NO GROWTH
Culture: NO GROWTH

## 2016-09-07 ENCOUNTER — Emergency Department (HOSPITAL_COMMUNITY)
Admission: EM | Admit: 2016-09-07 | Discharge: 2016-09-07 | Disposition: A | Discharge status: Home / Self Care | Payer: Medicaid Other | Attending: Emergency Medicine | Admitting: Emergency Medicine

## 2016-09-07 ENCOUNTER — Emergency Department (HOSPITAL_COMMUNITY): Payer: Medicaid Other

## 2016-09-07 ENCOUNTER — Encounter (HOSPITAL_COMMUNITY): Payer: Self-pay

## 2016-09-07 DIAGNOSIS — Z91010 Allergy to peanuts: Secondary | ICD-10-CM | POA: Diagnosis not present

## 2016-09-07 DIAGNOSIS — Y999 Unspecified external cause status: Secondary | ICD-10-CM | POA: Insufficient documentation

## 2016-09-07 DIAGNOSIS — Z859 Personal history of malignant neoplasm, unspecified: Secondary | ICD-10-CM | POA: Diagnosis not present

## 2016-09-07 DIAGNOSIS — S62347A Nondisplaced fracture of base of fifth metacarpal bone. left hand, initial encounter for closed fracture: Secondary | ICD-10-CM

## 2016-09-07 DIAGNOSIS — Y92410 Unspecified street and highway as the place of occurrence of the external cause: Secondary | ICD-10-CM | POA: Insufficient documentation

## 2016-09-07 DIAGNOSIS — S6992XA Unspecified injury of left wrist, hand and finger(s), initial encounter: Secondary | ICD-10-CM | POA: Diagnosis present

## 2016-09-07 DIAGNOSIS — F17200 Nicotine dependence, unspecified, uncomplicated: Secondary | ICD-10-CM | POA: Insufficient documentation

## 2016-09-07 DIAGNOSIS — Y9389 Activity, other specified: Secondary | ICD-10-CM | POA: Insufficient documentation

## 2016-09-07 DIAGNOSIS — W19XXXA Unspecified fall, initial encounter: Secondary | ICD-10-CM

## 2016-09-07 MED ORDER — OXYCODONE HCL 5 MG PO TABS: 5.0000 mg | ORAL_TABLET | ORAL | 0 refills | Status: DC | PRN

## 2016-09-07 MED ORDER — OXYCODONE HCL 5 MG PO TABS
5.0000 mg | ORAL_TABLET | Freq: Once | ORAL | Status: AC
  Administered 2016-09-07: 5 mg via ORAL
  Filled 2016-09-07: qty 1

## 2016-09-07 NOTE — ED Notes (Signed)
Ambulated pt from room to bathroom, pt back in room. No issues.

## 2016-09-07 NOTE — Progress Notes (Signed)
Orthopedic Tech Progress Note Patient Details:  Marie Parsons [birth year] [id number]  Ortho Devices Type of Ortho Device: Ulna gutter splint, Arm sling Ortho Device/Splint Location: lue Ortho Device/Splint Interventions: Ordered, Application   Karolee Stamps 09/07/2016, 7:46 PM
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
## 2016-09-07 NOTE — ED Notes (Signed)
ED Med student at bedside.
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
## 2016-09-07 NOTE — Discharge Instructions (Signed)
You were seen in the ED today with a hand fracture. We placed you in a splint that will need to stay clean and dry. You must follow up with the hand surgery team to reassess the hand especially because of the numbness.   Return to the ED with any sudden worsening hand pain, arm numbness, chest pain, or difficulty breathing.

## 2016-09-07 NOTE — ED Notes (Signed)
Called x 1 NO answer

## 2016-09-07 NOTE — ED Notes (Signed)
Arm sling applied by RN.

## 2017-02-14 ENCOUNTER — Inpatient Hospital Stay (HOSPITAL_COMMUNITY)
Admission: AD | Admit: 2017-02-14 | Discharge: 2017-02-14 | Disposition: A | Discharge status: Home / Self Care | Payer: Medicaid Other | Source: Ambulatory Visit | Attending: Obstetrics & Gynecology | Admitting: Obstetrics & Gynecology

## 2017-02-14 ENCOUNTER — Encounter (HOSPITAL_COMMUNITY): Payer: Self-pay

## 2017-02-14 DIAGNOSIS — R112 Nausea with vomiting, unspecified: Secondary | ICD-10-CM

## 2017-02-14 LAB — URINALYSIS, ROUTINE W REFLEX MICROSCOPIC
BILIRUBIN URINE: NEGATIVE
Glucose, UA: NEGATIVE mg/dL
HGB URINE DIPSTICK: NEGATIVE
Ketones, ur: NEGATIVE mg/dL
NITRITE: NEGATIVE
PROTEIN: NEGATIVE mg/dL
Specific Gravity, Urine: 1.015 (ref 1.005–1.030)
pH: 5 (ref 5.0–8.0)

## 2017-02-14 LAB — POCT PREGNANCY, URINE: Preg Test, Ur: NEGATIVE

## 2017-02-14 NOTE — Discharge Instructions (Signed)
Nausea and Vomiting, Adult Nausea is the feeling that you have an upset stomach or have to vomit. As nausea gets worse, it can lead to vomiting. Vomiting occurs when stomach contents are thrown up and out of the mouth. Vomiting can make you feel weak and cause you to become dehydrated. Dehydration can make you tired and thirsty, cause you to have a dry mouth, and decrease how often you urinate. Older adults and people with other diseases or a weak immune system are at higher risk for dehydration. It is important to treat your nausea and vomiting as told by your health care provider. Follow these instructions at home: Follow instructions from your health care provider about how to care for yourself at home. Eating and drinking Follow these recommendations as told by your health care provider:  Take an oral rehydration solution (ORS). This is a drink that is sold at pharmacies and retail stores.  Drink clear fluids in small amounts as you are able. Clear fluids include water, ice chips, diluted fruit juice, and low-calorie sports drinks.  Eat bland, easy-to-digest foods in small amounts as you are able. These foods include bananas, applesauce, rice, lean meats, toast, and crackers.  Avoid fluids that contain a lot of sugar or caffeine, such as energy drinks, sports drinks, and soda.  Avoid alcohol.  Avoid spicy or fatty foods.  General instructions  Drink enough fluid to keep your urine clear or pale yellow.  Wash your hands often. If soap and water are not available, use hand sanitizer.  Make sure that all people in your household wash their hands well and often.  Take over-the-counter and prescription medicines only as told by your health care provider.  Rest at home while you recover.  Watch your condition for any changes.  Breathe slowly and deeply when you feel nauseated.  Keep all follow-up visits as told by your health care provider. This is important. Contact a health care  provider if:  You have a fever.  You cannot keep fluids down.  Your symptoms get worse.  You have new symptoms.  Your nausea does not go away after two days.  You feel light-headed or dizzy.  You have a headache.  You have muscle cramps. Get help right away if:  You have pain in your chest, neck, arm, or jaw.  You feel extremely weak or you faint.  You have persistent vomiting.  You see blood in your vomit.  Your vomit looks like black coffee grounds.  You have bloody or black stools or stools that look like tar.  You have a severe headache, a stiff neck, or both.  You have a rash.  You have severe pain, cramping, or bloating in your abdomen.  You have trouble breathing or you are breathing very quickly.  Your heart is beating very quickly.  Your skin feels cold and clammy.  You feel confused.  You have pain when you urinate.  You have signs of dehydration, such as: ? Dark urine, very little urine, or no urine. ? Cracked lips. ? Dry mouth. ? Sunken eyes. ? Sleepiness. ? Weakness. These symptoms may represent a serious problem that is an emergency. Do not wait to see if the symptoms will go away. Get medical help right away. Call your local emergency services (911 in the [place] Do not drive yourself to the hospital. This information is not intended to replace advice given to you by your health care provider. Make sure you discuss any questions you   have with your health care provider. Document Released: 09/22/2005 Document Revised: 02/25/2016 Document Reviewed: 05/29/2015 Elsevier Interactive Patient Education  2017 [employer]  

## 2017-02-14 NOTE — Progress Notes (Addendum)
G P . Not sure if pregnant. UPT being done. Presents to triage for "throwing up a lot" since last month. She thinks she is pregnant. Last period was Aug 23, 2016. Denies LOF and "spot" bleeding that's bright red.   Urine pregnancy test negative

## 2017-02-14 NOTE — MAU Provider Note (Signed)
History     CSN: [id number]  Arrival date and time: 02/14/17 1532   First Provider Initiated Contact with Patient 02/14/17 1642      Chief Complaint  Patient presents with  . Morning Sickness  . bleedng   Marie Parsons is a 51 y.o. [id number] presents stating she thinks she is experiencing symptoms of pregnancy. She states menstrual periods were normal through 08/06/2016. Then she was amenorrheic in December and LMP was in January. She had nausea and vomiting earlier in the week and had slight pink spotting days ago. She states she has a pregnancy "flush."   Unable to obtain ROS or perform physical exam because she declined work- up, wanting to leave MAU abruptly after getting her negative UPT result.  Of note she has comorbidities of sickle cell anemia and schizophrenia.     OB History  Gravida Para Term Preterm AB Living  5 5 5  0 0 5  SAB TAB Ectopic Multiple Live Births               # Outcome Date GA Lbr Len/2nd Weight Sex Delivery Anes PTL Lv  5 Term           4 Term           3 Term           2 Term           1 Term                Past Medical History:  Diagnosis Date  . Cancer ([place])   . Seizures ([place])   . Sickle cell anemia ([facility])     History reviewed. No pertinent surgical history.  Family History  Problem Relation Age of Onset  . Arthritis Father   . Asthma Daughter     Social History  Substance Use Topics  . Smoking status: Current Some Day Smoker  . Smokeless tobacco: Never Used  . Alcohol use No    Allergies:  Allergies  Allergen Reactions  . Acetaminophen Nausea Only and Other (See Comments)    Seizures   . Aspirin Nausea Only and Other (See Comments)    Causes seizures   . Fish Allergy Other (See Comments)    Flounder and crover seizures  . Ibuprofen Nausea Only and Other (See Comments)    Seizures   . Peanut-Containing Drug Products Nausea And Vomiting    Prescriptions Prior to Admission  Medication Sig Dispense Refill Last  Dose  . naproxen sodium (ANAPROX) 220 MG tablet Take 220 mg by mouth daily as needed (For headache.).   02/13/2017 at Unknown time  . atenolol (TENORMIN) 50 MG tablet Take 1 tablet (50 mg total) by mouth 2 (two) times daily. 0900 & 1700 (Patient not taking: Reported on 02/14/2017) 60 tablet 0 Not Taking at Unknown time  . oxyCODONE (ROXICODONE) 5 MG immediate release tablet Take 1 tablet (5 mg total) by mouth every 4 (four) hours as needed for severe pain. (Patient not taking: Reported on 02/14/2017) 15 tablet 0 Not Taking at Unknown time    Review of Systems  Constitutional: Negative for fever.  Gastrointestinal: Positive for nausea and vomiting.  Genitourinary: Positive for vaginal bleeding. Negative for dysuria and vaginal discharge.  Psychiatric/Behavioral: Positive for agitation. Negative for confusion. The patient is nervous/anxious.    Physical Exam   Blood pressure 133/60, pulse 95, temperature 98 F (36.7 C), temperature source Oral, resp. rate 18, height 5\' 4"  (1.626 m), weight  77.1 kg (170 lb), last menstrual period 08/06/2016.  Physical Exam  Nursing note and vitals reviewed. Constitutional: She is oriented to person, place, and time. She appears well-developed and well-nourished. No distress.  HENT:  Head: Normocephalic.  Eyes: No scleral icterus.  Neck: Neck supple.  Cardiovascular: Normal rate.   Respiratory: Effort normal.  GI: Soft. There is tenderness.  Abbreviated exam: Abd flat with midline scar, mild periumbilical TTP, no guarding   Genitourinary:  Genitourinary Comments: Declined exam  Neurological: She is alert and oriented to person, place, and time.  Skin: Skin is warm and dry.  Psychiatric:  Anxious behavior; appropriate affect      MAU Course  Procedures Results for orders placed or performed during the hospital encounter of 02/14/17 (from the past 24 hour(s))  Urinalysis, Routine w reflex microscopic     Status: Abnormal   Collection Time: 02/14/17   3:44 PM  Result Value Ref Range   Color, Urine YELLOW YELLOW   APPearance CLOUDY (A) CLEAR   Specific Gravity, Urine 1.015 1.005 - 1.030   pH 5.0 5.0 - 8.0   Glucose, UA NEGATIVE NEGATIVE mg/dL   Hgb urine dipstick NEGATIVE NEGATIVE   Bilirubin Urine NEGATIVE NEGATIVE   Ketones, ur NEGATIVE NEGATIVE mg/dL   Protein, ur NEGATIVE NEGATIVE mg/dL   Nitrite NEGATIVE NEGATIVE   Leukocytes, UA LARGE (A) NEGATIVE   RBC / HPF 6-30 0 - 5 RBC/hpf   WBC, UA 6-30 0 - 5 WBC/hpf   Bacteria, UA RARE (A) NONE SEEN   Squamous Epithelial / LPF 6-30 (A) NONE SEEN   Mucous PRESENT   Pregnancy, urine POC     Status: None   Collection Time: 02/14/17  3:46 PM  Result Value Ref Range   Preg Test, Ur NEGATIVE NEGATIVE   MDM Briefly saw patient as she was preparing to leave exam room. Advised of negative pregnancy test and possibility of perimenopausal versus pathologic bleeding and need for follow up. Offered laboratory testing and exam but she declined.   Assessment and Plan   1. Nausea and vomiting, intractability of vomiting not specified, unspecified vomiting type    Allergies as of 02/14/2017      Reactions   Acetaminophen Nausea Only, Other (See Comments)   Seizures    Aspirin Nausea Only, Other (See Comments)   Causes seizures    Fish Allergy Other (See Comments)   Flounder and crover seizures   Ibuprofen Nausea Only, Other (See Comments)   Seizures    Peanut-containing Drug Products Nausea And Vomiting      Medication List    STOP taking these medications   atenolol 50 MG tablet Commonly known as:  TENORMIN   naproxen sodium 220 MG tablet Commonly known as:  ANAPROX   oxyCODONE 5 MG immediate release tablet Commonly known as:  ROXICODONE      Follow-up Information    Primary Care at [facility]. Schedule an appointment as soon as possible for a visit in 1 day(s).   Specialty:  Family Medicine Contact information: [place]  [place] 02/14/2017, 4:46 PM

## 2017-04-11 ENCOUNTER — Emergency Department (HOSPITAL_COMMUNITY)
Admission: EM | Admit: 2017-04-11 | Discharge: 2017-04-11 | Disposition: A | Discharge status: Home / Self Care | Payer: Medicaid Other | Attending: Emergency Medicine | Admitting: Emergency Medicine

## 2017-04-11 ENCOUNTER — Emergency Department (HOSPITAL_COMMUNITY): Payer: Medicaid Other

## 2017-04-11 ENCOUNTER — Encounter (HOSPITAL_COMMUNITY): Payer: Self-pay | Admitting: Emergency Medicine

## 2017-04-11 DIAGNOSIS — F17200 Nicotine dependence, unspecified, uncomplicated: Secondary | ICD-10-CM | POA: Insufficient documentation

## 2017-04-11 DIAGNOSIS — K5900 Constipation, unspecified: Secondary | ICD-10-CM | POA: Insufficient documentation

## 2017-04-11 DIAGNOSIS — Z91010 Allergy to peanuts: Secondary | ICD-10-CM | POA: Diagnosis not present

## 2017-04-11 DIAGNOSIS — Z859 Personal history of malignant neoplasm, unspecified: Secondary | ICD-10-CM | POA: Diagnosis not present

## 2017-04-11 DIAGNOSIS — D571 Sickle-cell disease without crisis: Secondary | ICD-10-CM | POA: Diagnosis not present

## 2017-04-11 LAB — URINALYSIS, ROUTINE W REFLEX MICROSCOPIC
BACTERIA UA: NONE SEEN
BILIRUBIN URINE: NEGATIVE
Glucose, UA: NEGATIVE mg/dL
Ketones, ur: NEGATIVE mg/dL
NITRITE: NEGATIVE
PROTEIN: NEGATIVE mg/dL
Specific Gravity, Urine: 1.018 (ref 1.005–1.030)
pH: 5 (ref 5.0–8.0)

## 2017-04-11 LAB — BASIC METABOLIC PANEL
Anion gap: 7 (ref 5–15)
BUN: 7 mg/dL (ref 6–20)
CALCIUM: 9.1 mg/dL (ref 8.9–10.3)
CHLORIDE: 107 mmol/L (ref 101–111)
CO2: 23 mmol/L (ref 22–32)
CREATININE: 0.48 mg/dL (ref 0.44–1.00)
GFR calc non Af Amer: >60 mL/min (ref >60)
Glucose, Bld: 100 mg/dL — ABNORMAL HIGH (ref 65–99)
Potassium: 3.3 mmol/L — ABNORMAL LOW (ref 3.5–5.1)
SODIUM: 137 mmol/L (ref 135–145)

## 2017-04-11 LAB — CBC WITH DIFFERENTIAL/PLATELET
BASOS PCT: 0 %
Basophils Absolute: 0 10*3/uL (ref 0.0–0.1)
EOS ABS: 0.2 10*3/uL (ref 0.0–0.7)
EOS PCT: 3 %
HCT: 37.9 % (ref 36.0–46.0)
HEMOGLOBIN: 12.9 g/dL (ref 12.0–15.0)
LYMPHS ABS: 3.1 10*3/uL (ref 0.7–4.0)
Lymphocytes Relative: 54 %
MCH: 29.4 pg (ref 26.0–34.0)
MCHC: 34 g/dL (ref 30.0–36.0)
MCV: 86.3 fL (ref 78.0–100.0)
Monocytes Absolute: 0.6 10*3/uL (ref 0.1–1.0)
Monocytes Relative: 10 %
NEUTROS PCT: 33 %
Neutro Abs: 1.9 10*3/uL (ref 1.7–7.7)
PLATELETS: 213 10*3/uL (ref 150–400)
RBC: 4.39 MIL/uL (ref 3.87–5.11)
RDW: 13.5 % (ref 11.5–15.5)
WBC: 5.8 10*3/uL (ref 4.0–10.5)

## 2017-04-11 MED ORDER — DOCUSATE SODIUM 100 MG PO CAPS
100.0000 mg | ORAL_CAPSULE | Freq: Once | ORAL | Status: AC
  Administered 2017-04-11: 100 mg via ORAL
  Filled 2017-04-11: qty 1

## 2017-04-11 MED ORDER — SORBITOL 70 % SOLN
960.0000 mL | TOPICAL_OIL | Freq: Once | ORAL | Status: DC
  Filled 2017-04-11: qty 240

## 2017-04-11 NOTE — ED Notes (Signed)
Pt updated on time before discharge.

## 2017-04-11 NOTE — ED Notes (Addendum)
PT states understanding of care given, follow up care. PT ambulated from ED to car with a steady gait.  

## 2017-04-11 NOTE — ED Provider Notes (Signed)
[facility] DEPT Provider Note   CSN: [id number] Arrival date & time: 04/11/17  1520     History   Chief Complaint Chief Complaint  Patient presents with  . Constipation    HPI Marie Parsons is a 51 y.o. female.  The history is provided by the patient.  Illness  This is a recurrent problem. The current episode started more than 2 days ago. The problem occurs daily. The problem has not changed since onset.Associated symptoms include abdominal pain. Pertinent negatives include no chest pain, no headaches and no shortness of breath. Associated symptoms comments: RLQ abdominal pain, hard stools . Exacerbated by: palpation. Nothing relieves the symptoms. She has tried nothing for the symptoms.    Past Medical History:  Diagnosis Date  . Cancer ([place])   . Seizures ([place])   . Sickle cell anemia [facility])     Patient Active Problem List   Diagnosis Date Noted  . Acute facial pain 05/30/2016  . Right-sided chest pain 05/30/2016  . Hypomagnesemia 05/30/2016  . Fever 05/29/2016  . Seizure disorder ([place]) 05/29/2016  . Tachycardia 05/29/2016  . Hypokalemia 05/29/2016  . THYROID STIMULATING HORMONE, ABNORMAL 10/10/2009  . CONSTIPATION, CHRONIC 10/10/2009  . GALACTORRHEA 10/10/2009    History reviewed. No pertinent surgical history.  OB History    Gravida Para Term Preterm AB Living   5 5 5  0 0 5   SAB TAB Ectopic Multiple Live Births                   Home Medications    Prior to Admission medications   Not on File    Family History Family History  Problem Relation Age of Onset  . Arthritis Father   . Asthma Daughter     Social History Social History  Substance Use Topics  . Smoking status: Current Some Day Smoker  . Smokeless tobacco: Never Used  . Alcohol use No     Allergies   Acetaminophen; Aspirin; Fish allergy; Ibuprofen; and Peanut-containing drug products   Review of Systems Review of Systems  Constitutional: Negative for chills and fever.   HENT: Negative for ear pain and sore throat.   Eyes: Negative for pain and visual disturbance.  Respiratory: Negative for cough and shortness of breath.   Cardiovascular: Negative for chest pain and palpitations.  Gastrointestinal: Positive for abdominal pain and constipation. Negative for abdominal distention, anal bleeding, nausea and vomiting.  Genitourinary: Positive for dysuria. Negative for decreased urine volume, difficulty urinating, hematuria, pelvic pain, vaginal bleeding and vaginal discharge.  Musculoskeletal: Negative for arthralgias and back pain.  Skin: Negative for color change and rash.  Neurological: Negative for seizures, syncope and headaches.  Psychiatric/Behavioral: Negative for agitation and behavioral problems.  All other systems reviewed and are negative.    Physical Exam Updated Vital Signs BP (!) 142/78 (BP Location: Left Arm)   Pulse 84   Temp 98.3 F (36.8 C) (Oral)   Resp 16   Ht 5\' 4"  (1.626 m)   Wt 81.2 kg (179 lb)   LMP 08/06/2016   SpO2 100%   BMI 30.73 kg/m   Physical Exam  Constitutional: She appears well-developed and well-nourished. No distress.  HENT:  Head: Normocephalic and atraumatic.  Eyes: Conjunctivae and EOM are normal. Pupils are equal, round, and reactive to light.  Neck: Normal range of motion. Neck supple. No tracheal deviation present.  Cardiovascular: Normal rate and regular rhythm.   No murmur heard. Pulmonary/Chest: Effort normal and breath sounds normal.  No respiratory distress.  Abdominal: Soft. Bowel sounds are normal. She exhibits no distension. There is tenderness (rlq). There is no rebound and no guarding.  Genitourinary:  Genitourinary Comments: No large hemorrhoids or obvious fissures or other signs of abnormalities   Musculoskeletal: She exhibits no edema.  Neurological: She is alert.  Skin: Skin is warm and dry.  Psychiatric: She has a normal mood and affect.  Nursing note and vitals reviewed.    ED  Treatments / Results  Labs (all labs ordered are listed, but only abnormal results are displayed) Labs Reviewed  URINALYSIS, ROUTINE W REFLEX MICROSCOPIC - Abnormal; Notable for the following:       Result Value   Hgb urine dipstick SMALL (*)    Leukocytes, UA SMALL (*)    Squamous Epithelial / LPF 0-5 (*)    All other components within normal limits  BASIC METABOLIC PANEL - Abnormal; Notable for the following:    Potassium 3.3 (*)    Glucose, Bld 100 (*)    All other components within normal limits  CBC WITH DIFFERENTIAL/PLATELET    EKG  EKG Interpretation None       Radiology Dg Abdomen Acute W/chest  Result Date: 04/11/2017 CLINICAL DATA:  Constipation.  History of sickle cell disease EXAM: DG ABDOMEN ACUTE W/ 1V CHEST COMPARISON:  Chest radiograph December 07, 2014; chest CT May 30, 2016; CT abdomen and pelvis May 30, 2016 FINDINGS: PA chest: No edema or consolidation. Heart size and pulmonary vascularity are normal. No adenopathy. There is aortic atherosclerosis. Supine and upright abdomen: There is moderate stool throughout the colon. There is no appreciable bowel dilatation or air-fluid level to suggest bowel obstruction. No free air. There are small phleboliths in the pelvis. IMPRESSION: Moderate stool in colon. No bowel obstruction or free air. No lung edema or consolidation. There is aortic atherosclerosis. Aortic Atherosclerosis (ICD10-I70.0). Electronically Signed   By: Lowella Grip III M.D.   On: 04/11/2017 19:49    Procedures Procedures (including critical care time)  Medications Ordered in ED Medications  docusate sodium (COLACE) capsule 100 mg (100 mg Oral Given 04/11/17 1933)     Initial Impression / Assessment and Plan / ED Course  I have reviewed the triage vital signs and the nursing notes.  Pertinent labs & imaging results that were available during my care of the patient were reviewed by me and considered in my medical decision making (see chart for  details).     Marie Parsons is a 51 year old female with history of constipation coming with constipation. States she had one bowel movement yesterday that was very hard. Denies having a high fiber diet. States she has not taken anything for constipation. No nausea, vomiting and tolerating by mouth. Also denies chest pain, shortness of breath. Does report some dysuria. No vaginal discharge or bleeding.  On exam she is sitting up in bed in no apparent distress with stable vital signs are within normal limits. Patient does have some right lower quadrant abdominal tenderness that she states is usually there when she is constipated. Labs reassuring. X-ray shows moderate stool burden and no acute abnormalities Do not believe CT abdomen indicated. Likely constipation as this is consistent with prior episodes of constipation for this patient. Patient denied wanting an enema here despite discussing risks benefits benefits. Patient given Colace and instructed to use Colace and MiraLAX as an outpatient and have follow-up with her primary care physician in one week for recheck. She was understanding and agreement  to the plan and was comfortable with outpatient follow-up. Also instructed to start a high fiber diet to decrease fats and high salts.  Patient was seen with my attending, Dr. Gilford Raid, who voiced agreement and oversaw the evaluation and treatment of this patient.   [employer] seismologist was used in the creation of this note. If there are any errors or inconsistencies needing clarification, please contact me directly.   Final Clinical Impressions(s) / ED Diagnoses   Final diagnoses:  Constipation, unspecified constipation type    New Prescriptions There are no discharge medications for this patient.    Valda Lamb, MD 04/12/17 [id number]    Isla Pence, MD 04/12/17 [phone]

## 2017-04-11 NOTE — ED Notes (Signed)
Pt told about the risk and benefit about the enema pt refused. EDP at bedside talking to the patient.

## 2017-04-11 NOTE — ED Triage Notes (Signed)
Pt c/o feeling constipated for about 1 week. Last BM was yesterday and reports, "Felt like a brick."

## 2017-07-22 IMAGING — CT CT ANGIO CHEST
2 of 6 series · 18 of 36 positions shown · IV contrast (Omni 300)
Comparison: None available

CLINICAL DATA: Hypoxia and tachycardia.

EXAM:
CT ANGIOGRAPHY CHEST WITH CONTRAST
TECHNIQUE: Multidetector CT imaging of the chest was performed using the
standard protocol during bolus administration of intravenous
contrast. Multiplanar CT image reconstructions and MIPs were
obtained to evaluate the vascular anatomy.
CONTRAST:  100 cc Isovue 370 intravenous

[Series 6: pe thins · axial · 0.69mm/px · z∈[-316,-84]mm · 17 of 262 slices shown]
[im 15/262  lung]
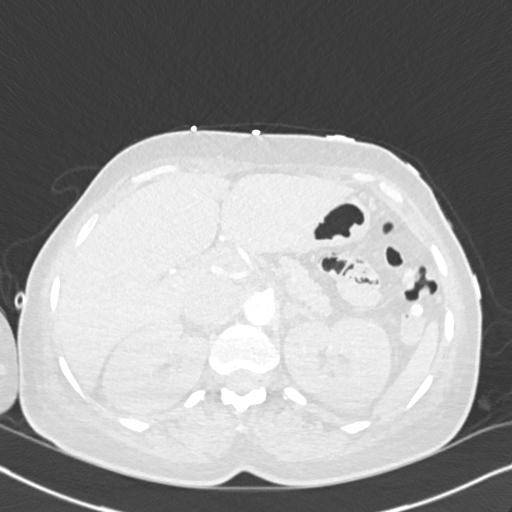
[im 30/262  mediastinal]
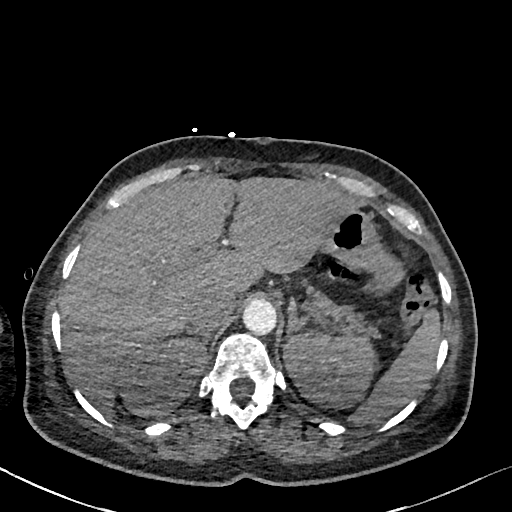
[im 44/262  lung]
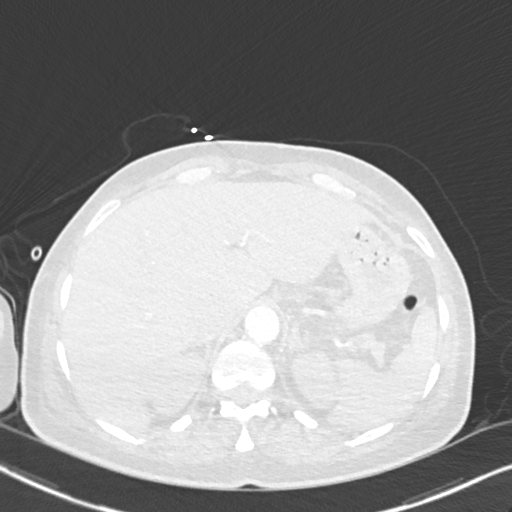
[im 59/262  mediastinal]
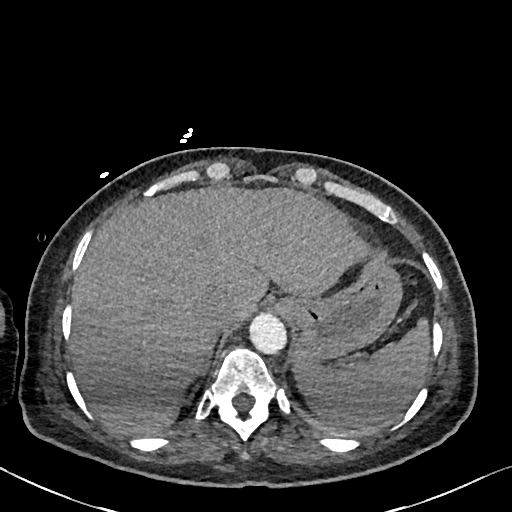
[im 73/262  lung]
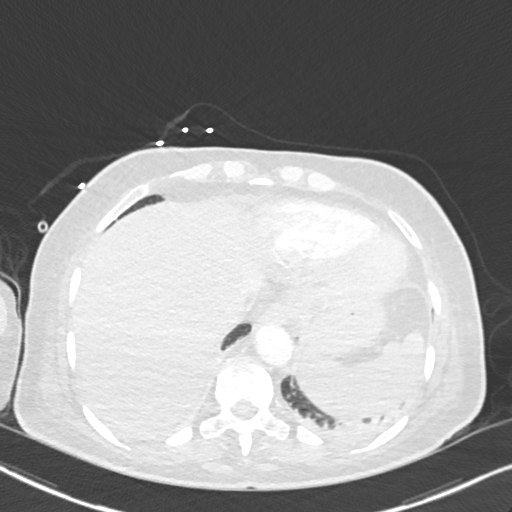
[im 88/262  mediastinal]
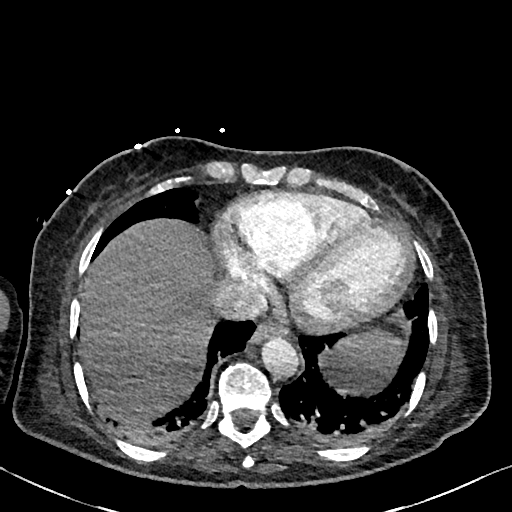
[im 102/262  lung]
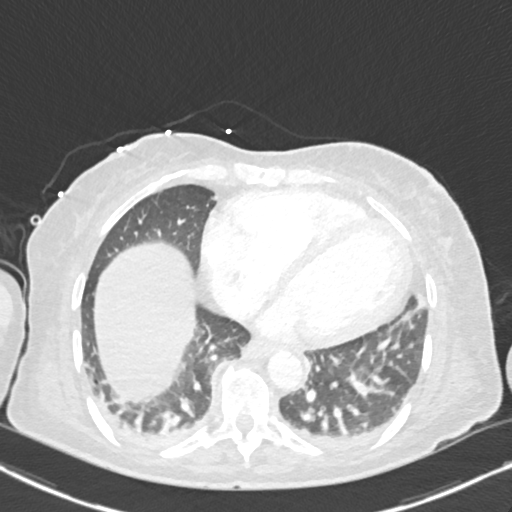
[im 117/262  mediastinal]
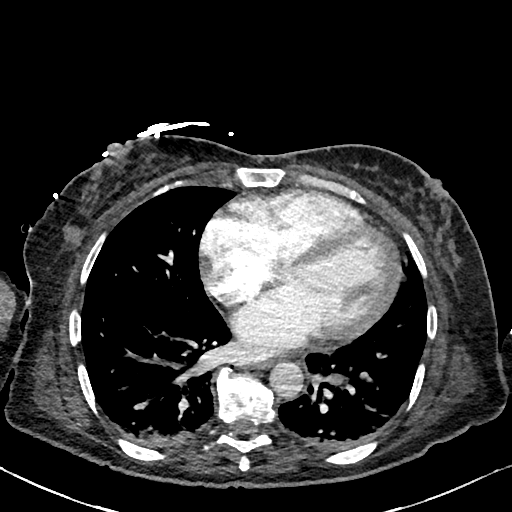
[im 131/262  lung]
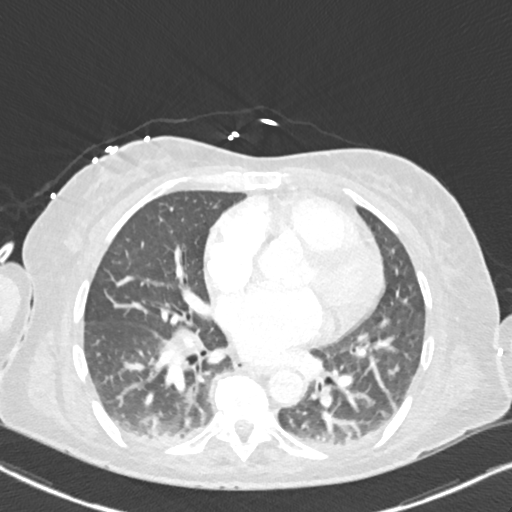
[im 146/262  mediastinal]
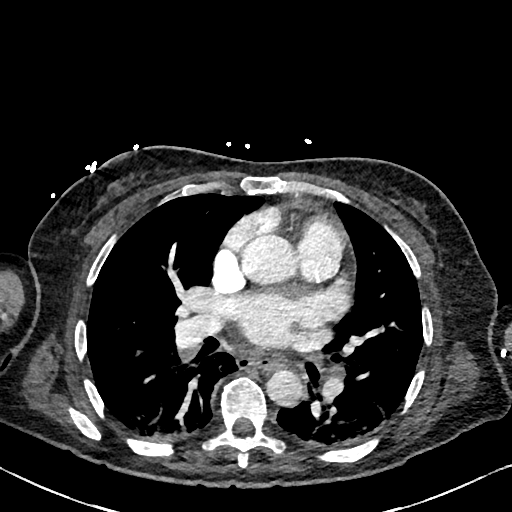
[im 160/262  lung]
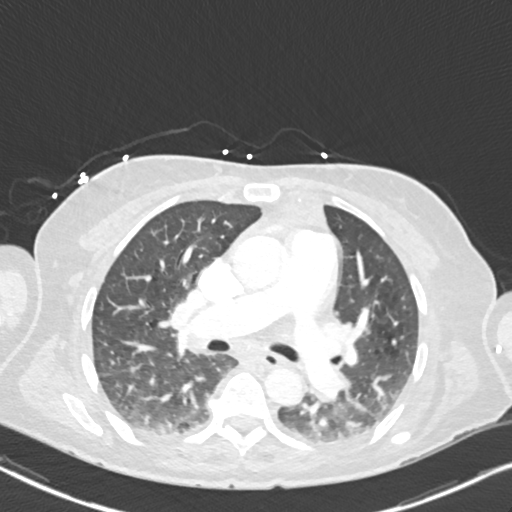
[im 175/262  mediastinal]
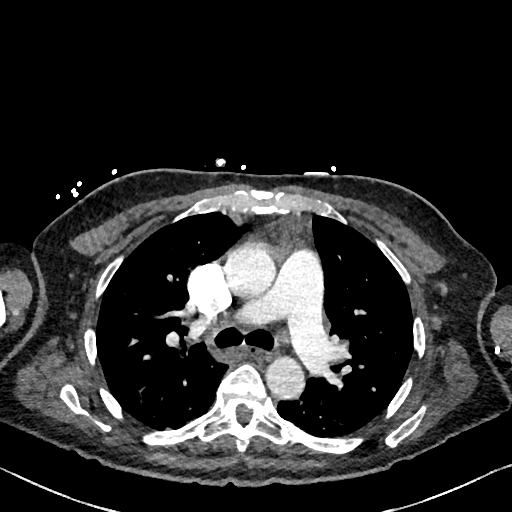
[im 189/262  lung]
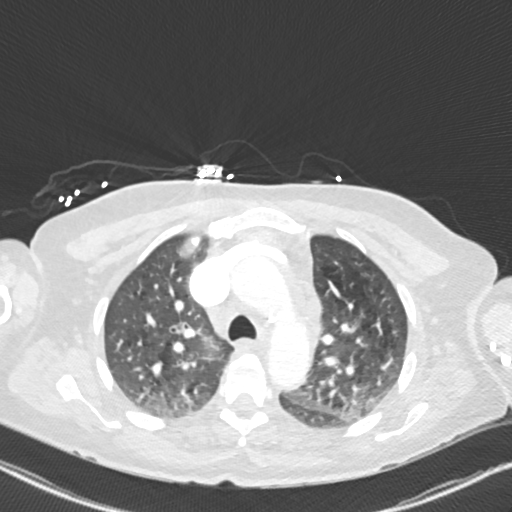
[im 204/262  mediastinal]
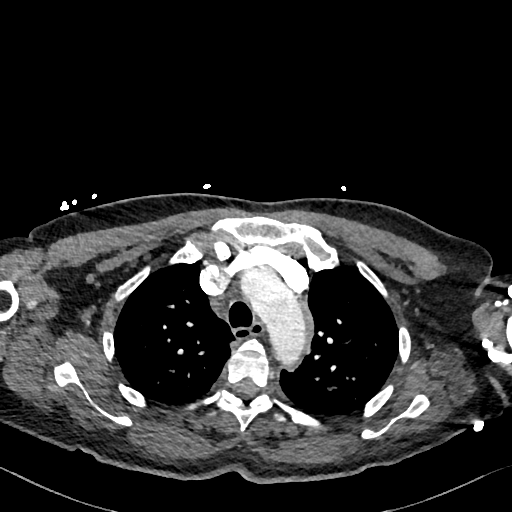
[im 218/262  lung]
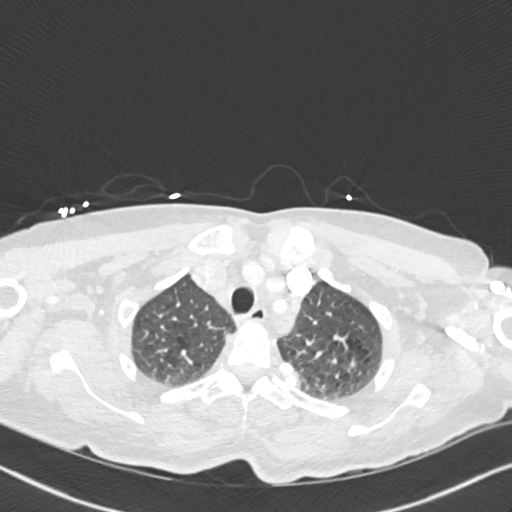
[im 233/262  mediastinal]
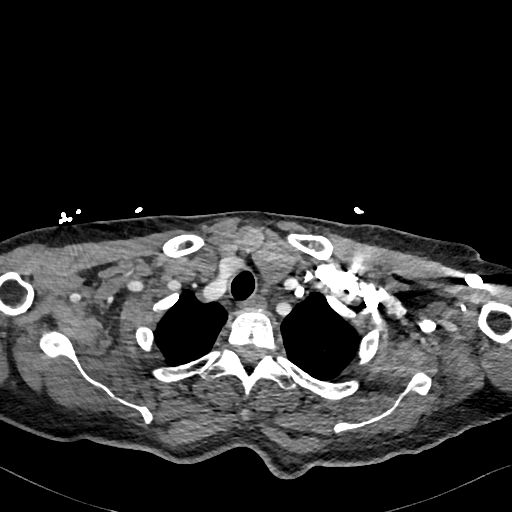
[im 247/262  lung]
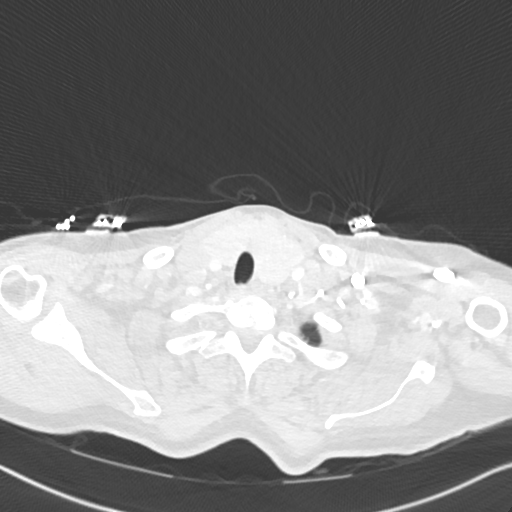

[Series 7: pe 2mm cor · coronal · 0.52mm/px · 1 of 111 slices shown]
[im 56/111  mediastinal]
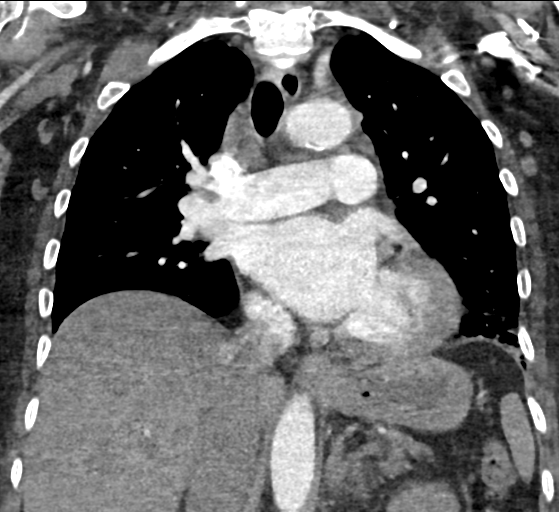

[18 of 36 positions shown; findings below may reference images not displayed]

FINDINGS: Cardiovascular: Study optimized for the pulmonary arteries. No
filling defect to suggest pulmonary embolism. No evidence of acute
aortic syndrome. Borderline cardiomegaly. No pericardial effusion.
Atherosclerotic calcifications seen along the aorta and coronaries.

Mediastinum: Symmetric, partly seen goiter. Mild nonspecific
enlargement of hilar and mediastinal lymph nodes. Hilar lymph nodes
measure up to 1 cm.

Lungs/Pleura: Expiratory phase with dependent atelectasis. Diffuse
airway thickening. Centrilobular emphysema. Bilateral septal lines
may be atelectatic; no definitive pulmonary edema

Upper abdomen: No acute findings. Replaced right hepatic artery to
the SMA.

Musculoskeletal: Exaggerated thoracic kyphosis. No acute or
aggressive finding.

Review of the MIP images confirms the above findings.
IMPRESSION: 1. No evidence of pulmonary embolism.
2.  Emphysema. ([id number]) and airway thickening.
3. Mild nonspecific mediastinal and hilar adenopathy, potentially
related to #2. Correlate with labs.
4. Atherosclerosis, including the coronary arteries.
5. Goiter.

## 2017-07-22 IMAGING — CT CT MAXILLOFACIAL W/O CM
3 series · 15 of 47 positions shown, 18 images · non-contrast
Comparison: None.

CLINICAL DATA: Acute facial pain, reportedly dental pain on the
right

EXAM:
CT MAXILLOFACIAL WITHOUT CONTRAST
TECHNIQUE: Multidetector CT imaging of the maxillofacial structures was
performed. Multiplanar CT image reconstructions were also generated.
A small metallic BB was placed on the right temple in order to
reliably differentiate right from left.

[Series 2: facialbone 2.0 st · axial · 0.31mm/px · z∈[-14,+134]mm · 9 of 86 slices shown, 12 images]
[im 6/86  brain]
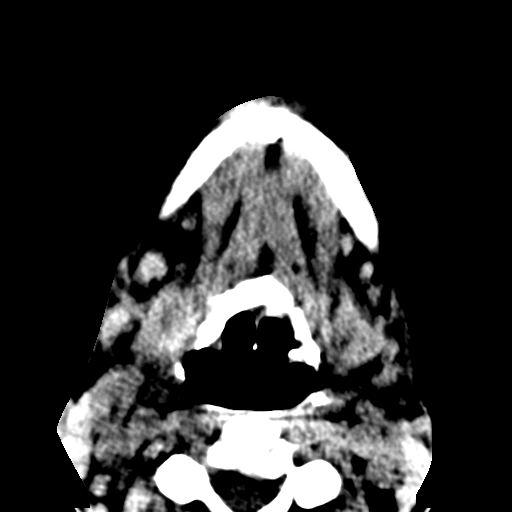
[im 6/86  bone]
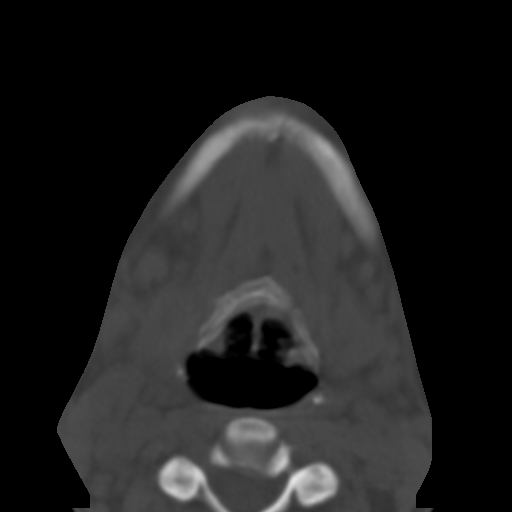
[im 15/86  bone]
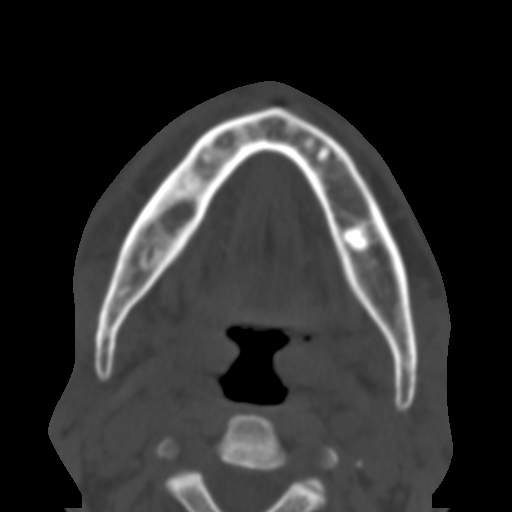
[im 24/86  bone]
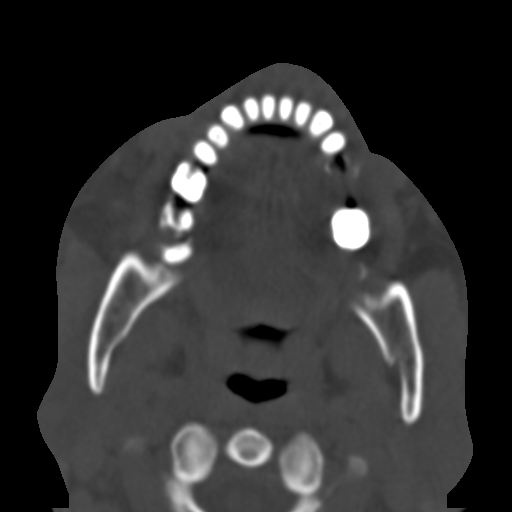
[im 33/86  bone]
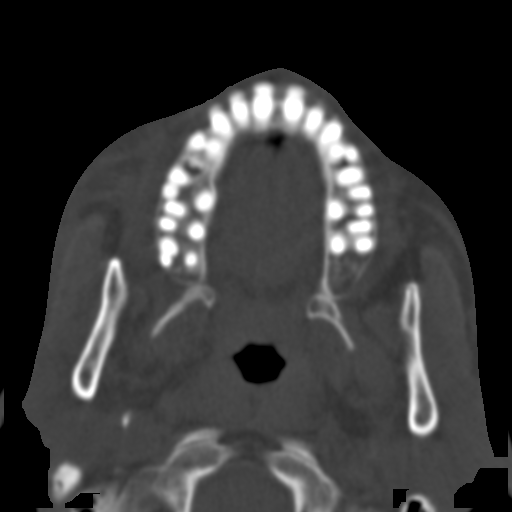
[im 44/86  brain]
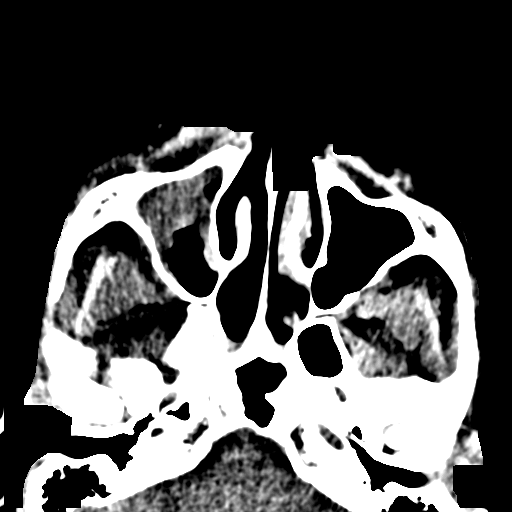
[im 44/86  bone]
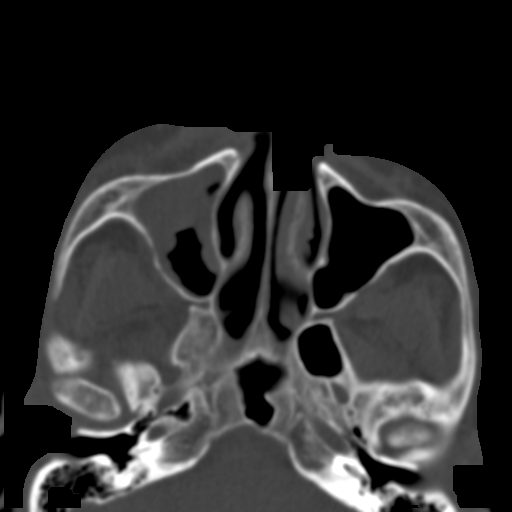
[im 53/86  bone]
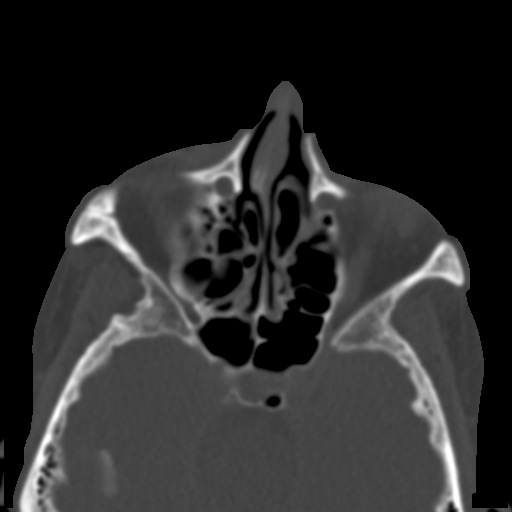
[im 62/86  bone]
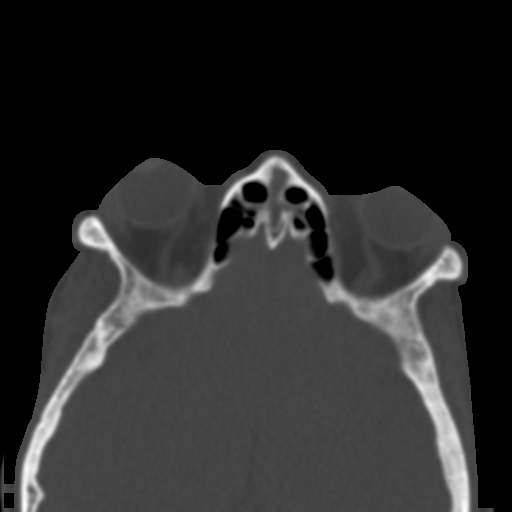
[im 71/86  bone]
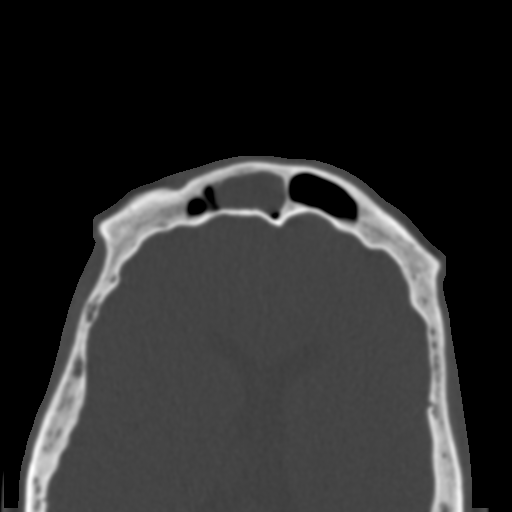
[im 80/86  brain]
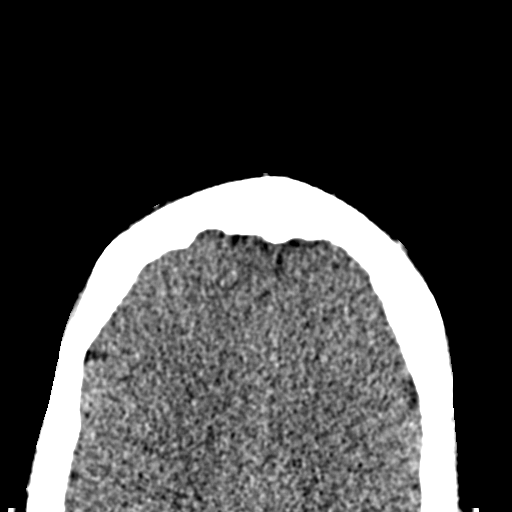
[im 80/86  bone]
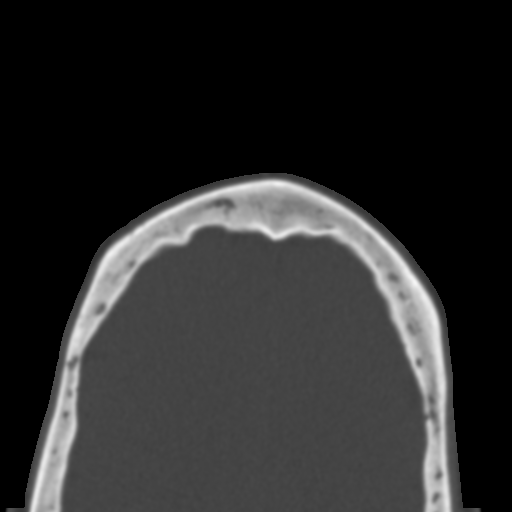

[Series 6: facialbone 2.0 cor st · coronal · 0.34mm/px · 3 of 81 slices shown]
[im 27/81  bone]
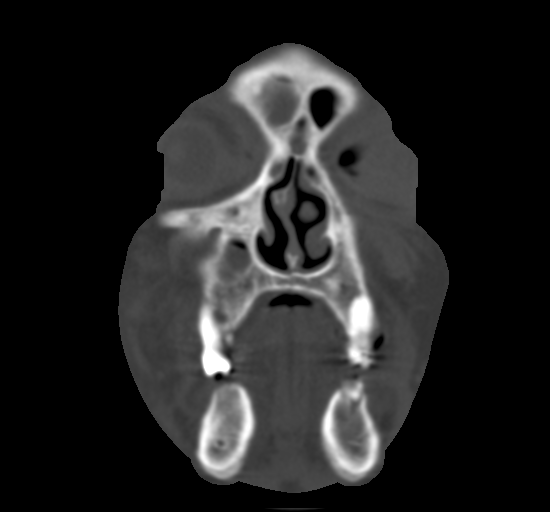
[im 36/81  bone]
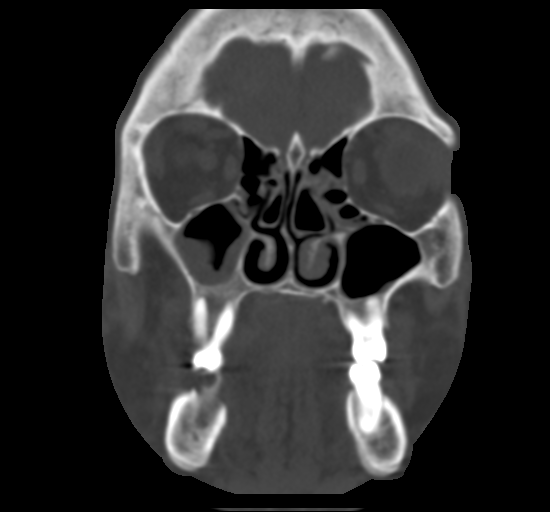
[im 45/81  bone]
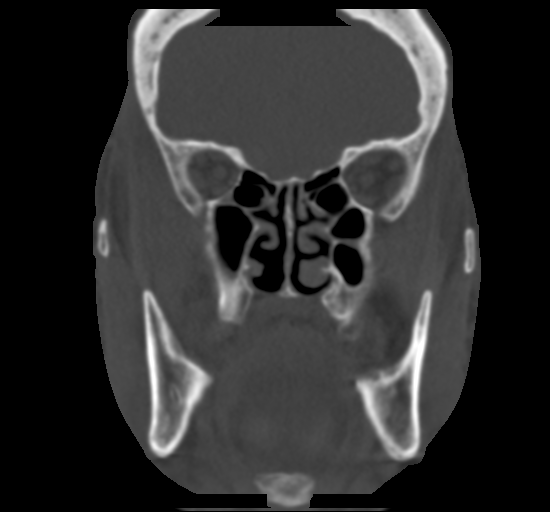

[Series 7: facialbone 2.0 sag st · sagittal · 0.32mm/px · 3 of 79 slices shown]
[im 27/79  bone]
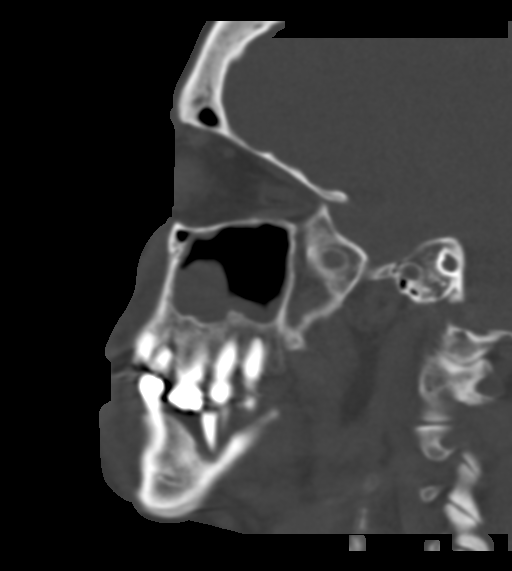
[im 40/79  bone]
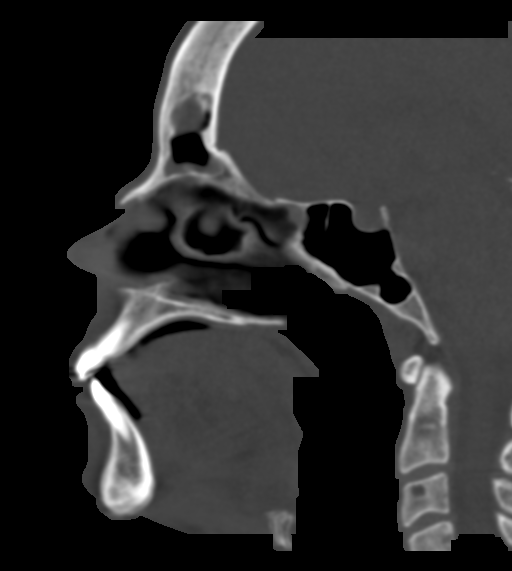
[im 53/79  bone]
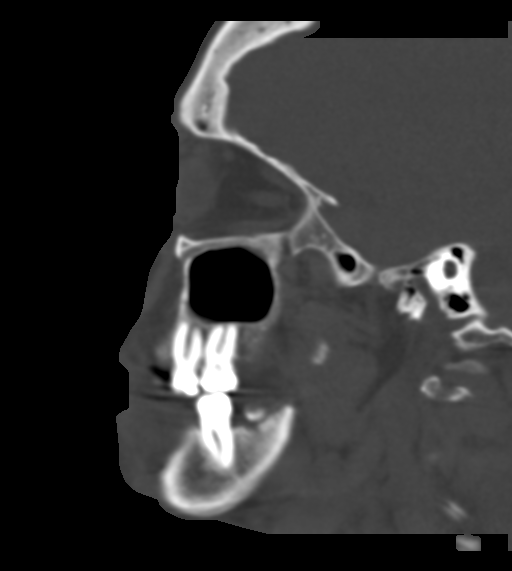

[15 of 47 positions shown; findings below may reference images not displayed]

FINDINGS: There are cavities throughout the bilateral maxillary and mandibular
teeth, worse in the mandible where there are large periapical
erosions around the right-sided remaining molars, right canine, and
bilateral first premolars. Notable given the acute inflammation,
there is also periapical erosion of the right upper terminal molar.

Asymmetric inflammation around the right face, seen in the buccal
fat pad and tracking along the lower posterior right maxillary
sinus. Given reported dental pain this is presumably active
odontogenic infection. There is right maxillary sinusitis, but
chronic appearing and without periantral fat edema to suggest
invasive sinusitis. Mucosal thickening is greatest on the floor of
the right maxillary sinus where there is likely large mucous
retention cyst. No evidence of floor mouth swelling or airway
compromise. Mild enlargement of cervical lymph nodes which may be
reactive in this setting.

Polyp or retention cyst in the inferior right frontal sinus,
nonobstructive. Negative orbital and partial intracranial imaging.
IMPRESSION: Right face inflammation/infection, favored odontogenic. No visible
abscess, but limited without IV contrast.

## 2017-11-18 ENCOUNTER — Ambulatory Visit: Payer: Self-pay | Admitting: Family Medicine

## 2018-01-05 ENCOUNTER — Other Ambulatory Visit: Payer: Self-pay

## 2018-01-05 ENCOUNTER — Ambulatory Visit: Payer: Medicaid Other | Admitting: Family Medicine

## 2018-01-05 ENCOUNTER — Encounter: Payer: Self-pay | Admitting: Family Medicine

## 2018-01-05 VITALS — BP 102/62 | HR 103 | Temp 97.8°F | Ht 64.0 in | Wt 163.0 lb

## 2018-01-05 DIAGNOSIS — D573 Sickle-cell trait: Secondary | ICD-10-CM | POA: Diagnosis not present

## 2018-01-05 DIAGNOSIS — Z1231 Encounter for screening mammogram for malignant neoplasm of breast: Secondary | ICD-10-CM

## 2018-01-05 DIAGNOSIS — Z1239 Encounter for other screening for malignant neoplasm of breast: Secondary | ICD-10-CM

## 2018-01-05 DIAGNOSIS — Z1211 Encounter for screening for malignant neoplasm of colon: Secondary | ICD-10-CM | POA: Diagnosis not present

## 2018-01-05 LAB — POCT GLYCOSYLATED HEMOGLOBIN (HGB A1C): Hemoglobin A1C: 5.2

## 2018-01-05 NOTE — Patient Instructions (Signed)
It was great seeing you today! We have addressed the following issues today  1. I will follow up on the blood work from today. 2. I place an order for a mammogram make sure your call the breast center. 3. You will also receive a cal about scheduling an colonoscopy. 4. Work on smoking cessation.  If we did any lab work today, and the results require attention, either me or my nurse will get in touch with you. If everything is normal, you will get a letter in mail and a message via . If you don't hear from [place] in two weeks, please give [place] a call. Otherwise, we look forward to seeing you again at your next visit. If you have any questions or concerns before then, please call the clinic at [phone].  Please bring all your medications to every doctors visit  Sign up for My Chart to have easy access to your labs results, and communication with your Primary care physician. Please ask Front Desk for some assistance.   Please check-out at the front desk before leaving the clinic.    Take Care,   Dr. Andy Gauss

## 2018-01-05 NOTE — Progress Notes (Signed)
Subjective:   Chief Complaint  Patient presents with  . New Patient (Initial Visit)   HPI [place] is a 52 y.o. old female here  for annual exam.  Concern today: None Changes in his/her health in the last 12 months: no Occupation: babysitting  Wears seatbelt: yes.    The patient has regular exercise: walking.   Enough vegetables and fruits: yes.  Smokes cigarette: yes, recently restarted after stopping for 5 years Drinks EtOH: no Drug use: no Patient takes ASA: no.  Patient takes vitD & Ca: no. Ever been transfused or tattooed?: no.  The patient is not currently sexually active.  Patient uses birth control: no.  Domestic violence: no.  Advance directive: no. MOST: no.   History of depression:no.  Patient dental home: yes.   Immunizations  Needs influenza vaccine: yes.  Needs HPV (Women until age 60): yes.  Needs Shingrix (all >6yrs of age): yes.  Needs Tdap: yes.  Needs Pneumococcal: no. 1. 78 to 54 years of age  -Intermediate risk groups (smokers; chronic heart, lung and liver  disease, DM & alcoholism) PPSV23 alone:  (Grade 1B).   -High risk groups (asplenia, immunocompromised [HIV, CA], CSF leak, cochlear implant, advanced kidney dis)-PCV13, then PPSV23 after 8 wks. (Grade 1B). PCV13 after 65yr If already had PPSV23.  2.   Age ? 65: PCV13 followed by PPSV23 6 to 12 months later. PCV13 after 62yr If already had PPSV23.  Screening Need colon cancer screening: yes. Need breast cancer ccreening: yes. Need cervical cancer Screening: yes. STOP BANG >/=3 for OSA: no. Need lung cancer screening (men > 55):no. Need AAA screening (men 65-74, >100 cigarettes):no At risk for skin cancer: no. Need HCV Screening: yes. Need STI Screening: no. Fall in the last 12 months:no  PMH/Problem List: has THYROID STIMULATING HORMONE, ABNORMAL; CONSTIPATION, CHRONIC; GALACTORRHEA; Fever; Seizure disorder ([place]); Tachycardia; Hypokalemia; Acute facial pain; Right-sided chest  pain; Hypomagnesemia; and Sickle cell trait ([place]) on their problem list.   has a past medical history of Cancer ([place]), Seizures ([place]), and Sickle cell anemia ([place]).  [facility]  Family History  Problem Relation Age of Onset  . Arthritis Father   . Asthma Daughter    Family history of heart disease before age of 17 yrs: no. Family history of stroke: no. Family history of cancer: no.  SH Social History   Tobacco Use  . Smoking status: Current Some Day Smoker  . Smokeless tobacco: Never Used  Substance Use Topics  . Alcohol use: No  . Drug use: No     Review of Systems      Objective:   Physical Exam Vitals:   01/05/18 1413  BP: 102/62  Pulse: (!) 103  Temp: 97.8 F (36.6 C)  TempSrc: Oral  SpO2: 98%  Weight: 163 lb (73.9 kg)  Height: 5\' 4"  (1.626 m)   Body mass index is 27.98 kg/m.  GEN: appears well & comfortable. No apparent distress. Head: normocephalic and atraumatic  Eyes: conjunctiva without injection. Sclera anicteric. Ears: external ear, ear canal and TM normal Nares: no rhinorrhea.  swollen turbinates. erythema of nasal mucosa Oropharynx: MMM. No erythema. exudation or petechiae.  Uvula midline HEM: mild tenderness on palpation of lateral neck, minimal swelling, no nodule CVS: RRR, nl s1 & s2, no murmurs, no edema,  2+ pulses bilaterally, cap refills brisk RESP: no IWOB, good air movement bilaterally, CTAB GI: BS present & normal, soft, NTND, no guarding, no rebound, no palpable mass GU: no suprapubic or CVA  tenderness MSK: no focal tenderness or notable swelling SKIN: no apparent skin lesion  ENDO: negative thyromegally  NEURO: alert and oiented appropriately, no gross deficits   PSYCH: euthymic mood with congruent affect    Assessment & Plan:   #Initial encounter, establishing care with PCP Patient with no acute issues today in visit. Patient recently resumed smoking after 5 years. Discuss smoking cessation. Patient was mildly tender to palpation  during thyroid exam. Patient is not taking any medication at the moment for what appear to be hyperthyroidism with  Last TSH in 2011 of 0.004. Marland Kitchen She is only taking antiseizure medication but she unable to remember the name of the medication. --Order CBC, CMP, A1c, TSH, HIV and Hep C will follow up on results. --Order mammogram and colonoscopy --Will discuss pap smear at next office visit   Marjie Skiff, MD [place], PGY-2  01/05/18  5:54 PM

## 2018-01-06 ENCOUNTER — Other Ambulatory Visit: Payer: Self-pay | Admitting: Family Medicine

## 2018-01-06 DIAGNOSIS — E0590 Thyrotoxicosis, unspecified without thyrotoxic crisis or storm: Secondary | ICD-10-CM

## 2018-01-06 LAB — CMP14+EGFR
ALBUMIN: 4 g/dL (ref 3.5–5.5)
ALK PHOS: 193 IU/L — AB (ref 39–117)
ALT: 14 IU/L (ref 0–32)
AST: 23 IU/L (ref 0–40)
Albumin/Globulin Ratio: 1.1 — ABNORMAL LOW (ref 1.2–2.2)
BUN/Creatinine Ratio: 12 (ref 9–23)
BUN: 5 mg/dL — AB (ref 6–24)
Bilirubin Total: 0.2 mg/dL (ref 0.0–1.2)
CALCIUM: 9 mg/dL (ref 8.7–10.2)
CO2: 24 mmol/L (ref 20–29)
CREATININE: 0.41 mg/dL — AB (ref 0.57–1.00)
Chloride: 108 mmol/L — ABNORMAL HIGH (ref 96–106)
GFR calc Af Amer: 136 mL/min/{1.73_m2} (ref >59)
GFR, EST NON AFRICAN AMERICAN: 118 mL/min/{1.73_m2} (ref >59)
GLOBULIN, TOTAL: 3.5 g/dL (ref 1.5–4.5)
GLUCOSE: 87 mg/dL (ref 65–99)
Potassium: 3.7 mmol/L (ref 3.5–5.2)
SODIUM: 145 mmol/L — AB (ref 134–144)
Total Protein: 7.5 g/dL (ref 6.0–8.5)

## 2018-01-06 LAB — CBC WITH DIFFERENTIAL/PLATELET
BASOS ABS: 0 10*3/uL (ref 0.0–0.2)
Basos: 0 %
EOS (ABSOLUTE): 0.2 10*3/uL (ref 0.0–0.4)
Eos: 4 %
HEMOGLOBIN: 12.8 g/dL (ref 11.1–15.9)
Hematocrit: 37.9 % (ref 34.0–46.6)
IMMATURE GRANULOCYTES: 0 %
Immature Grans (Abs): 0 10*3/uL (ref 0.0–0.1)
LYMPHS: 44 %
Lymphocytes Absolute: 2.1 10*3/uL (ref 0.7–3.1)
MCH: 28.3 pg (ref 26.6–33.0)
MCHC: 33.8 g/dL (ref 31.5–35.7)
MCV: 84 fL (ref 79–97)
Monocytes Absolute: 0.5 10*3/uL (ref 0.1–0.9)
Monocytes: 10 %
NEUTROS PCT: 42 %
Neutrophils Absolute: 2.1 10*3/uL (ref 1.4–7.0)
Platelets: 249 10*3/uL (ref 150–379)
RBC: 4.52 x10E6/uL (ref 3.77–5.28)
RDW: 16.1 % — ABNORMAL HIGH (ref 12.3–15.4)
WBC: 4.9 10*3/uL (ref 3.4–10.8)

## 2018-01-06 LAB — HIV ANTIBODY (ROUTINE TESTING W REFLEX): HIV Screen 4th Generation wRfx: NONREACTIVE

## 2018-01-06 LAB — HEPATITIS C ANTIBODY: Hep C Virus Ab: 0.1 s/co ratio (ref 0.0–0.9)

## 2018-01-06 LAB — TSH

## 2018-01-09 LAB — T3: T3 TOTAL: 311 ng/dL — AB (ref 71–180)

## 2018-01-09 LAB — T4, FREE: Free T4: 3.81 ng/dL — ABNORMAL HIGH (ref 0.82–1.77)

## 2018-01-09 LAB — SPECIMEN STATUS REPORT

## 2018-01-09 LAB — T3, FREE: T3 FREE: 12.7 pg/mL — AB (ref 2.0–4.4)

## 2018-01-12 ENCOUNTER — Other Ambulatory Visit: Payer: Self-pay | Admitting: Family Medicine

## 2018-01-12 DIAGNOSIS — E0590 Thyrotoxicosis, unspecified without thyrotoxic crisis or storm: Secondary | ICD-10-CM

## 2018-01-14 ENCOUNTER — Other Ambulatory Visit: Payer: Medicaid Other

## 2018-01-14 DIAGNOSIS — E0590 Thyrotoxicosis, unspecified without thyrotoxic crisis or storm: Secondary | ICD-10-CM

## 2018-01-15 ENCOUNTER — Other Ambulatory Visit: Payer: Self-pay

## 2018-01-15 ENCOUNTER — Encounter: Payer: Self-pay | Admitting: Family Medicine

## 2018-01-15 ENCOUNTER — Ambulatory Visit: Payer: Medicaid Other | Admitting: Family Medicine

## 2018-01-15 DIAGNOSIS — E0500 Thyrotoxicosis with diffuse goiter without thyrotoxic crisis or storm: Secondary | ICD-10-CM | POA: Diagnosis present

## 2018-01-15 LAB — THYROTROPIN RECEPTOR AUTOABS: Thyrotropin Receptor Ab: 9.56 IU/L — ABNORMAL HIGH (ref 0.00–1.75)

## 2018-01-15 LAB — T3: T3 TOTAL: 296 ng/dL — AB (ref 71–180)

## 2018-01-15 LAB — T3, FREE: T3, Free: 9.6 pg/mL — ABNORMAL HIGH (ref 2.0–4.4)

## 2018-01-15 LAB — T4, FREE: FREE T4: 3.07 ng/dL — AB (ref 0.82–1.77)

## 2018-01-15 MED ORDER — CALCIUM CITRATE 250 MG PO TABS: 500.0000 mg | ORAL_TABLET | Freq: Every day | ORAL | 0 refills | Status: AC

## 2018-01-15 MED ORDER — PROPRANOLOL HCL 60 MG PO TABS: 120.0000 mg | ORAL_TABLET | Freq: Every day | ORAL | 0 refills | Status: AC

## 2018-01-15 MED ORDER — VITAMIN D (CHOLECALCIFEROL) 10 MCG (400 UNIT) PO TABS: 400.0000 mg | ORAL_TABLET | Freq: Two times a day (BID) | ORAL | 0 refills | Status: DC

## 2018-01-15 NOTE — Assessment & Plan Note (Addendum)
Patient presents today to follow-up on abnormal thyroid labs.  Patient has low TSH with elevated T3 and T4 consistent with hyperthyroidism patient also has elevated thyroid globulin antibodies consistent with Graves' disease.  Patient was previously treated for this condition.  TSH was low back in 2011 without any follow-up.  Given chronicity of her condition patient will likely need ablation.  We will first order thyroid uptake scan and likely refer to endocrinology for further management.  In the meantime will start patient on medication for symptom control.  Discussed with patient change in appetite and should become more euthyroid.  Also discussed risk for osteoporosis. --Start patient on propranolol 120 mg daily --Start patient on vitamin D and calcium --Order thyroid uptake scan --Follow-up after scan for endocrinology referral

## 2018-01-15 NOTE — Progress Notes (Signed)
   Subjective:    Patient ID: Marie Parsons, female    DOB: Sep 24, 1966, 52 y.o.   MRN: [id number]   CC: Follow-up for abnormal thyroid labs   HPI: Patient is a 52 year old female who presents today to follow-up on recent lab work showing abnormal thyroid results.  Patient presented here today at my request after thyroid labs showed hypothyroidism consistent with Graves' disease.  Patient was not aware of condition prior to diagnosis at last office visit.  She does complain of inability to gain weight over the years and some palpitations intermittently.  Patient was otherwise asymptomatic.  She denies any chest pain, shortness of breath, apparent pain, nausea, vomiting, headache, dizziness.  Smoking status reviewed   ROS: all other systems were reviewed and are negative other than in the HPI   Past Medical History:  Diagnosis Date  . Cancer ([place])   . Seizures ([place])   . Sickle cell anemia ([facility])     History reviewed. No pertinent surgical history.  Past medical history, surgical, family, and social history reviewed and updated in the EMR as appropriate.  Objective:  BP 130/72   Pulse 90   Temp 98.7 F (37.1 C) (Oral)   Ht 5\' 4"  (1.626 m)   Wt 166 lb (75.3 kg)   LMP 08/06/2016   SpO2 97%   BMI 28.49 kg/m   Vitals and nursing note reviewed  General: NAD, pleasant, able to participate in exam HEENT: Enlarged thyroid on palpation, no lid lag or proptosis,  fine  quiver of the tongue, mild temporal wasting bilaterally Cardiac: RRR, normal heart sounds, no murmurs. 2+ radial and PT pulses bilaterally Respiratory: CTAB, normal effort, No wheezes, rales or rhonchi Abdomen: soft, nontender, nondistended, no hepatic or splenomegaly, +BS Extremities: no edema or cyanosis. WWP. Skin: warm and dry, no rashes noted Neuro: alert and oriented x4, no focal deficits, bilateral upper extremity fine tremors. Psych: Normal affect and mood   Assessment & Plan:    Graves disease Patient  presents today to follow-up on abnormal thyroid labs.  Patient has low TSH with elevated T3 and T4 consistent with hyperthyroidism patient also has elevated thyroid globulin antibodies consistent with Graves' disease.  Patient was previously treated for this condition.  TSH was low back in 2011 without any follow-up.  Given chronicity of her condition patient will likely need ablation.  We will first order thyroid uptake scan and likely refer to endocrinology for further management.  In the meantime will start patient on medication for symptom control.  Discussed with patient change in appetite and should become more euthyroid.  Also discussed risk for osteoporosis. --Start patient on propranolol 120 mg daily --Start patient on vitamin D and calcium --Order thyroid uptake scan --Follow-up after scan for endocrinology referral    Marjie Skiff, MD [place] PGY-2

## 2018-01-15 NOTE — Patient Instructions (Signed)
Hyperthyroidism Hyperthyroidism is when the thyroid is too active (overactive). Your thyroid is a large gland that is located in your neck. The thyroid helps to control how your body uses food (metabolism). When your thyroid is overactive, it produces too much of a hormone called thyroxine. What are the causes? Causes of hyperthyroidism may include:  Graves disease. This is when your immune system attacks the thyroid gland. This is the most common cause.  Inflammation of the thyroid gland.  Tumor in the thyroid gland or somewhere else.  Excessive use of thyroid medicines, including: ? Prescription thyroid supplement. ? Herbal supplements that mimic thyroid hormones.  Solid or fluid-filled lumps within your thyroid gland (thyroid nodules).  Excessive ingestion of iodine.  What increases the risk?  Being female.  Having a family history of thyroid conditions. What are the signs or symptoms? Signs and symptoms of hyperthyroidism may include:  Nervousness.  Inability to tolerate heat.  Unexplained weight loss.  Diarrhea.  Change in the texture of hair or skin.  Heart skipping beats or making extra beats.  Rapid heart rate.  Loss of menstruation.  Shaky hands.  Fatigue.  Restlessness.  Increased appetite.  Sleep problems.  Enlarged thyroid gland or nodules.  How is this diagnosed? Diagnosis of hyperthyroidism may include:  Medical history and physical exam.  Blood tests.  Ultrasound tests.  How is this treated? Treatment may include:  Medicines to control your thyroid.  Surgery to remove your thyroid.  Radiation therapy.  Follow these instructions at home:  Take medicines only as directed by your health care provider.  Do not use any tobacco products, including cigarettes, chewing tobacco, or electronic cigarettes. If you need help quitting, ask your health care provider.  Do not exercise or do physical activity until your health care provider  approves.  Keep all follow-up appointments as directed by your health care provider. This is important. Contact a health care provider if:  Your symptoms do not get better with treatment.  You have fever.  You are taking thyroid replacement medicine and you: ? Have depression. ? Feel mentally and physically slow. ? Have weight gain. Get help right away if:  You have decreased alertness or a change in your awareness.  You have abdominal pain.  You feel dizzy.  You have a rapid heartbeat.  You have an irregular heartbeat. This information is not intended to replace advice given to you by your health care provider. Make sure you discuss any questions you have with your health care provider. Document Released: 09/22/2005 Document Revised: 02/21/2016 Document Reviewed: 02/07/2014 Elsevier Interactive Patient Education  2018 Elsevier Inc.  

## 2018-01-27 ENCOUNTER — Encounter (HOSPITAL_COMMUNITY): Payer: Medicaid Other | Attending: Family Medicine

## 2018-01-28 ENCOUNTER — Encounter (HOSPITAL_COMMUNITY): Admission: RE | Admit: 2018-01-28 | Payer: Medicaid Other | Source: Ambulatory Visit

## 2018-01-29 ENCOUNTER — Ambulatory Visit: Payer: Medicaid Other | Admitting: Family Medicine

## 2018-02-05 ENCOUNTER — Encounter: Payer: Self-pay | Admitting: Family Medicine

## 2018-02-11 ENCOUNTER — Other Ambulatory Visit: Payer: Self-pay | Admitting: Family Medicine

## 2018-02-11 ENCOUNTER — Telehealth: Payer: Self-pay | Admitting: *Deleted

## 2018-02-11 DIAGNOSIS — E0500 Thyrotoxicosis with diffuse goiter without thyrotoxic crisis or storm: Secondary | ICD-10-CM

## 2018-02-11 NOTE — Telephone Encounter (Signed)
LM for patient to call back regarding her imaging appointment.  Will continue to try and reach her about that appt as well as the one I made with Dr. Andy Gauss for after her scan.  Yaremi Stahlman,CMA

## 2018-02-11 NOTE — Telephone Encounter (Signed)
-----   Message from Marjie Skiff, MD sent at 02/11/2018  2:54 PM EDT ----- Order is in.  Thanks  Marjie Skiff, MD [place], PGY-2  ----- Message ----- From: Valerie Roys, CMA Sent: 02/11/2018   2:38 PM To: Marjie Skiff, MD  I spoke to the patient and she said that she couldn't make the appt and wanted to reschedule.  I spoke with radiology and the order has changed and now needs to be ordered as [id number].  Just let me know when you have changed it and I will call them to schedule.     ----- Message ----- From: Marjie Skiff, MD Sent: 02/11/2018   2:13 PM To: Valerie Roys, CMA  Kellin Bartling,  Can you check on this patient she missed her thyroid uptake scan and clinic follow up visit with me. Called her last week but she did not answer. I want to make sure we do not lose her, she needs to be followed for her newly diagnosed hyperthyroidism.  Thanks  Marjie Skiff, MD [place], PGY-2

## 2018-02-16 ENCOUNTER — Encounter: Payer: Self-pay | Admitting: *Deleted

## 2018-02-16 NOTE — Telephone Encounter (Signed)
Letter mailed to patient with appointment dates and instructions.  Laneice Meneely,CMA

## 2018-03-01 ENCOUNTER — Ambulatory Visit (HOSPITAL_COMMUNITY): Payer: Medicaid Other

## 2018-03-01 ENCOUNTER — Encounter (HOSPITAL_COMMUNITY): Payer: Medicaid Other

## 2018-03-02 ENCOUNTER — Other Ambulatory Visit (HOSPITAL_COMMUNITY): Payer: Medicaid Other

## 2018-03-09 ENCOUNTER — Encounter (HOSPITAL_COMMUNITY): Payer: Medicaid Other

## 2018-03-09 ENCOUNTER — Encounter (HOSPITAL_COMMUNITY): Payer: Medicaid Other | Attending: Family Medicine

## 2018-03-10 ENCOUNTER — Encounter (HOSPITAL_COMMUNITY): Admission: RE | Admit: 2018-03-10 | Payer: Medicaid Other | Source: Ambulatory Visit

## 2018-03-10 ENCOUNTER — Ambulatory Visit: Payer: Medicaid Other | Admitting: Family Medicine

## 2018-05-11 DIAGNOSIS — Z3009 Encounter for other general counseling and advice on contraception: Secondary | ICD-10-CM | POA: Diagnosis not present

## 2018-05-11 DIAGNOSIS — Z0389 Encounter for observation for other suspected diseases and conditions ruled out: Secondary | ICD-10-CM | POA: Diagnosis not present

## 2018-05-11 DIAGNOSIS — Z1388 Encounter for screening for disorder due to exposure to contaminants: Secondary | ICD-10-CM | POA: Diagnosis not present

## 2018-11-04 DIAGNOSIS — R102 Pelvic and perineal pain: Secondary | ICD-10-CM | POA: Diagnosis not present

## 2018-11-04 DIAGNOSIS — E0500 Thyrotoxicosis with diffuse goiter without thyrotoxic crisis or storm: Secondary | ICD-10-CM | POA: Diagnosis not present

## 2018-11-04 DIAGNOSIS — G40909 Epilepsy, unspecified, not intractable, without status epilepticus: Secondary | ICD-10-CM | POA: Diagnosis not present

## 2018-11-04 DIAGNOSIS — R300 Dysuria: Secondary | ICD-10-CM | POA: Diagnosis not present

## 2018-11-04 DIAGNOSIS — I10 Essential (primary) hypertension: Secondary | ICD-10-CM | POA: Diagnosis not present

## 2018-11-04 DIAGNOSIS — Z124 Encounter for screening for malignant neoplasm of cervix: Secondary | ICD-10-CM | POA: Diagnosis not present

## 2019-04-15 ENCOUNTER — Ambulatory Visit: Payer: Medicaid Other | Admitting: Family Medicine

## 2019-04-15 ENCOUNTER — Ambulatory Visit: Payer: Medicaid Other

## 2019-04-20 ENCOUNTER — Encounter: Payer: Self-pay | Admitting: Family Medicine

## 2019-04-20 ENCOUNTER — Ambulatory Visit (INDEPENDENT_AMBULATORY_CARE_PROVIDER_SITE_OTHER): Payer: Medicaid Other | Admitting: Family Medicine

## 2019-04-20 ENCOUNTER — Other Ambulatory Visit: Payer: Self-pay

## 2019-04-20 VITALS — BP 128/78 | HR 71 | Wt 200.0 lb

## 2019-04-20 DIAGNOSIS — J45909 Unspecified asthma, uncomplicated: Secondary | ICD-10-CM

## 2019-04-20 DIAGNOSIS — F39 Unspecified mood [affective] disorder: Secondary | ICD-10-CM

## 2019-04-20 DIAGNOSIS — K5900 Constipation, unspecified: Secondary | ICD-10-CM | POA: Diagnosis not present

## 2019-04-20 MED ORDER — POLYETHYLENE GLYCOL 3350 17 GM/SCOOP PO POWD: 17.0000 g | Freq: Two times a day (BID) | ORAL | 1 refills | Status: AC | PRN

## 2019-04-20 MED ORDER — FLUTICASONE PROPIONATE 50 MCG/ACT NA SUSP: 2.0000 | Freq: Every day | NASAL | 6 refills | Status: DC

## 2019-04-20 MED ORDER — CETIRIZINE HCL 10 MG PO TABS: 10.0000 mg | ORAL_TABLET | Freq: Every day | ORAL | 3 refills | Status: DC

## 2019-04-20 NOTE — Patient Instructions (Signed)
Thank you for coming in to see [place] today! Please see below to review our plan for today's visit:  1. Take Cetirizine 10mg  daily for allergy control. 2. Use flonase/fluticasone daily by spraying it into each nostril like I showed you. 3. Come back and see [place] in 2-4 weeks. Please bring in ANY AND ALL MEDICATIONS TO YOUR NEXT APPT.  Please call the clinic at [phone] if your symptoms worsen or you have any concerns. It was our pleasure to serve you!   Dr. Milus Banister [facility]

## 2019-04-20 NOTE — Progress Notes (Signed)
   Subjective:    Patient ID: Marie Parsons, female    DOB: 16-May-1966, 53 y.o.   MRN: [id number]   CC: sore throat, itchy eyes  HPI: Sore throat: since last week on Wednesday, pain with swallowing, no other pains, no sick contacts. Is having runny and stuffy nose, itchy and watery eyes with redness, no NVD, denies fevers, no ear pain. Eyes are watery and feel scratchy.   Constipation: has history of chronic constipation, would like miralax to help with symptoms as this has helped before.  Smoking status reviewed: quit Nov 2016  Review of Systems: denies fevers, chills, body aches, N, V, D,    Objective:  BP 128/78   Pulse 71   Wt 200 lb (90.7 kg)   LMP 08/06/2016   SpO2 99%   BMI 34.33 kg/m  Vitals and nursing note reviewed  General: well nourished, in no acute distress HEENT: normocephalic, TM's visualized bilaterally and appear normal, no active nasal discharge but edematous nares are present, moist mucous membranes, poor dentition, no exudates noted in posterior oropharynx, mild pharyngeal erythema and drainage Cardiac: RRR, clear S1 and S2, no murmurs, rubs, or gallops Respiratory: clear to auscultation bilaterally, no increased work of breathing Skin: warm and dry, no rashes noted Neuro: alert and oriented, no focal deficits  Assessment & Plan:    Asthma due to seasonal allergies -Flonase to each nostril daily and Cetirizine 10mg  daily -Patient instructed to return in 2-4 weeks for follow up of other chronic conditions (mood disorder, Grave's disease, etc)  Constipation -Miralax daily  Return in about 4 weeks (around 05/18/2019) for Thyroid.  Dr. Milus Banister [facility], PGY-2

## 2019-04-20 NOTE — Assessment & Plan Note (Signed)
-   Miralax daily

## 2019-04-20 NOTE — Assessment & Plan Note (Signed)
-  Flonase to each nostril daily and Cetirizine 10mg  daily -Patient instructed to return in 2-4 weeks for follow up of other chronic conditions (mood disorder, Grave's disease, etc)

## 2019-05-13 ENCOUNTER — Ambulatory Visit: Payer: Medicaid Other | Admitting: Family Medicine

## 2019-11-15 ENCOUNTER — Encounter: Payer: Self-pay | Admitting: Family Medicine

## 2019-11-15 ENCOUNTER — Other Ambulatory Visit: Payer: Self-pay

## 2019-11-15 ENCOUNTER — Ambulatory Visit (INDEPENDENT_AMBULATORY_CARE_PROVIDER_SITE_OTHER): Payer: Medicaid Other | Admitting: Family Medicine

## 2019-11-15 VITALS — BP 120/76 | HR 85

## 2019-11-15 DIAGNOSIS — E0500 Thyrotoxicosis with diffuse goiter without thyrotoxic crisis or storm: Secondary | ICD-10-CM | POA: Diagnosis not present

## 2019-11-15 DIAGNOSIS — Z23 Encounter for immunization: Secondary | ICD-10-CM

## 2019-11-15 DIAGNOSIS — R222 Localized swelling, mass and lump, trunk: Secondary | ICD-10-CM | POA: Diagnosis not present

## 2019-11-15 DIAGNOSIS — K0889 Other specified disorders of teeth and supporting structures: Secondary | ICD-10-CM

## 2019-11-15 DIAGNOSIS — K2100 Gastro-esophageal reflux disease with esophagitis, without bleeding: Secondary | ICD-10-CM

## 2019-11-15 DIAGNOSIS — K602 Anal fissure, unspecified: Secondary | ICD-10-CM | POA: Diagnosis not present

## 2019-11-15 DIAGNOSIS — Z0000 Encounter for general adult medical examination without abnormal findings: Secondary | ICD-10-CM

## 2019-11-15 LAB — POCT GLYCOSYLATED HEMOGLOBIN (HGB A1C): Hemoglobin A1C: 5.6 % (ref 4.0–5.6)

## 2019-11-15 MED ORDER — FAMOTIDINE 10 MG PO TABS: 10.0000 mg | ORAL_TABLET | Freq: Every day | ORAL | 3 refills | Status: DC

## 2019-11-15 MED ORDER — POLYETHYLENE GLYCOL 3350 17 GM/SCOOP PO POWD: 17.0000 g | Freq: Two times a day (BID) | ORAL | 1 refills | Status: DC | PRN

## 2019-11-15 NOTE — Progress Notes (Signed)
Subjective:    Patient ID: Marie Parsons, female    DOB: 1965-12-10, 54 y.o.   MRN: [id number]   CC: Teeth hurting, knot on her chest, breast pain, burning in chest, blood with pooping  HPI: Marie Parsons is a pleasant 54 year old female with history of Graves' disease, seizure disorder, sickle cell trait, asthma, and seasonal allergies presenting to clinic today with concern for her teeth hurting.   Teeth hurting: has been going on for lmost 2 weeks. She unfortunately has a history of poor dentition and many dental caries requiring extraction of many teeth. To relieve her pain and prevent infection she has been boiling hot water and dissolving salt in it, cooling it a little and then swishing it around in her mouth before spitting it back out. She says the heat is very painful. She has an appt Tueday, Feb 16th at 9am to have her teeth pulled. Denies any mouth swelling, drainage, fevers, body aches, chills, as well as difficulty swallowing or breathing.   Bleeding, pain with bowel movements: Patient has had 1-2 episodes of bloody stools.  States she will have some pain and bleeding, particularly when patient is constipated and attempting to have a bowel movement.  She has not tried any medications or diet changes to improve her symptoms.  Symptoms most likely concerning for anal fissure versus hemorrhoid.  Denies fatigue, shortness of breath, nausea, vomiting, abdominal pain, weight loss, night sweats.  Patient was offered anoscopy and respectfully declined.  Knot on chest: patient has a firm, painful knot on her chest located between her breasts. She has had this knot for almost over a year, states it has slowly gotten bigger. Sometimes when she applies pressure it will excrete thick yellow material.  She has not tried anything that makes her symptoms better or worse.  Denies bleeding, purulent drainage, fevers, body aches.  GERD: patient having burning in her chest around eating, sometimes  has hot metallic taste in her mouth.  Denies other chest pains, shortness of breath, any radiation of pain into her abdomen, jaw, or arm.  Denies any right upper quadrant abdominal pain.  Has not tried anything to make her symptoms better.  States it is worse with spicy, fried, or and acidic foods.  History of Graves' disease: Patient's last labs evaluating thyroid function were performed April 2019, T3 and T4 elevated.  She denies any temperature intolerance, fatigue, diarrhea, constipation, and hair/skin changes.  Health maintenance: Patient is due for colonoscopy, mammogram, Tdap, flu shot, and Pap smear (patient reports last Pap 2010).    Smoking status reviewed - quit last year 2020    Review of Systems - see HPI   Objective:  BP 120/76   Pulse 85   LMP 08/06/2016   SpO2 99%  Vitals and nursing note reviewed  General: well nourished, in no acute distress HEENT: See photos below Neck: No thyromegaly or goiter, no lymphadenopathy Cardiac: RRR, clear S1 and S2, no murmurs appreciated Respiratory: CTA bilaterally, see photo below for chest wall nodule Abdomen: Nontender to palpation, normal bowel sounds appreciated     Dental caries, poor dentition, no evidence of infection          Tender superficial nodule appreciated to patient's anterior chest wall  Assessment & Plan:   Routine adult health maintenance Patient is due for colonoscopy and mammogram, as well as Tdap and flu shot.  Would also benefit from having HbA1c and BMP checked at today's visit. -HbA1c checked, within normal  limits -BMP -Tdap and flu shot given -Orders placed for colonoscopy and mammogram -Additionally, patient is due for Pap smear (has not had a Pap since 2010), is scheduled for 12/05/2019 for Pap smear.  Graves disease Checking thyroid panel today -We will contact patient with results  Gastroesophageal reflux disease with esophagitis without hemorrhage Patient symptoms concerning for acid  reflux. -Prescribed famotidine to be taken daily -Instructed patient to avoid eating and then lying down -Instructed patient to avoid foods that trigger acid reflux, including spicy, fried, and other acidic foods.  Anal fissure Patient has had 1-2 episodes of bloody stools in January 2021.  Endorses symptoms are worse with constipation.  Symptoms most likely concerning for anal fissure versus hemorrhoid.  Patient was offered anoscopy and respectfully declined. -Patient prescribed MiraLAX, instructed to titrate meds and so that she can have 1-2 soft stools daily.  Instructed to temporarily stop and then reduce MiraLAX if she develops diarrhea.  Need for immunization against influenza -Patient received flu shot at today's visit  Pain in a tooth or teeth Upon close examination patient does not have any concerning signs or evidence for infection of her teeth. -Patient instructed to follow-up with her dentist, already has appointment for February 16 for extraction of teeth -Patient was informed she can continue to swish salt water to decrease infection, however to avoid very hot or very cold temperatures to prevent further damage and decrease pain -Patient offered Tylenol and Advil to be taken together to control her pain, but stated she cannot take these meds because they cause her to have ?Seizures (patient states when she takes them she "starts to shake real bad").  Nodule of anterior chest wall Nodule of chest appears to be producing sort of caseous discharge, no purulent discharge appreciated on today's exam. -Nodule very tender to touch, patient would most likely benefit from incision and drainage of superficial nodule under local anesthetic -Patient informed she could take Tylenol and ibuprofen together for pain control, but states she cannot take these as they "cause her to have seizures".   Return in about 20 days (around 12/05/2019) for Pap.   Dr. Milus Banister [facility], PGY-2

## 2019-11-15 NOTE — Patient Instructions (Addendum)
Thank you for coming in to see [place] today! Please see below to review our plan for today's visit:  1. GO TO YOUR DENTIST ASAP! 2. You can swish with salt water - avoid very hot or very cold temperatures. 3. Take 1-2 capfuls of miralax every day. You want to take enough to get 1 soft poops in every day. This medication will help to soften your stool and help to stop bleeding and pain with using the bathroom.  4. In the shower, let warm water hit the spot on your chest to soft it up. Then during the day I want you to get a warm, wet washcloth and apply it to the knot 2-3 times daily for 15 minutes each.  5. Acid reflux/ Heart burn: take Pepcid / Famotidine 10mg  daily.  6. You are due for Colonoscopy and mammogram.   FOLLOW UP Monday, March 1st, 2021, at 1:50pm.  Please call the clinic at [phone] if your symptoms worsen or you have any concerns. It was our pleasure to serve you!  Dr. Milus Banister [facility]

## 2019-11-16 LAB — BASIC METABOLIC PANEL
BUN/Creatinine Ratio: 14 (ref 9–23)
BUN: 10 mg/dL (ref 6–24)
CO2: 23 mmol/L (ref 20–29)
Calcium: 9.2 mg/dL (ref 8.7–10.2)
Chloride: 103 mmol/L (ref 96–106)
Creatinine, Ser: 0.69 mg/dL (ref 0.57–1.00)
GFR calc Af Amer: 113 mL/min/{1.73_m2} (ref >59)
GFR calc non Af Amer: 98 mL/min/{1.73_m2} (ref >59)
Glucose: 100 mg/dL — ABNORMAL HIGH (ref 65–99)
Potassium: 3.8 mmol/L (ref 3.5–5.2)
Sodium: 141 mmol/L (ref 134–144)

## 2019-11-16 LAB — THYROID PANEL WITH TSH
Free Thyroxine Index: 1.6 (ref 1.2–4.9)
T3 Uptake Ratio: 27 % (ref 24–39)
T4, Total: 6.1 ug/dL (ref 4.5–12.0)
TSH: 1.09 u[IU]/mL (ref 0.450–4.500)

## 2019-11-17 ENCOUNTER — Encounter: Payer: Self-pay | Admitting: Family Medicine

## 2019-11-19 DIAGNOSIS — Z0000 Encounter for general adult medical examination without abnormal findings: Secondary | ICD-10-CM | POA: Insufficient documentation

## 2019-11-19 DIAGNOSIS — K0889 Other specified disorders of teeth and supporting structures: Secondary | ICD-10-CM | POA: Insufficient documentation

## 2019-11-19 DIAGNOSIS — Z23 Encounter for immunization: Secondary | ICD-10-CM | POA: Insufficient documentation

## 2019-11-19 DIAGNOSIS — K2100 Gastro-esophageal reflux disease with esophagitis, without bleeding: Secondary | ICD-10-CM | POA: Insufficient documentation

## 2019-11-19 DIAGNOSIS — K602 Anal fissure, unspecified: Secondary | ICD-10-CM | POA: Insufficient documentation

## 2019-11-19 DIAGNOSIS — R222 Localized swelling, mass and lump, trunk: Secondary | ICD-10-CM | POA: Insufficient documentation

## 2019-11-19 NOTE — Assessment & Plan Note (Addendum)
Patient is due for colonoscopy and mammogram, as well as Tdap and flu shot.  Would also benefit from having HbA1c and BMP checked at today's visit. -HbA1c checked, within normal limits -BMP -Tdap and flu shot given -Orders placed for colonoscopy and mammogram -Additionally, patient is due for Pap smear (has not had a Pap since 2010), is scheduled for 12/05/2019 for Pap smear.

## 2019-11-19 NOTE — Assessment & Plan Note (Signed)
-  Patient received flu shot at today's visit 

## 2019-11-19 NOTE — Assessment & Plan Note (Signed)
Patient has had 1-2 episodes of bloody stools in January 2021.  Endorses symptoms are worse with constipation.  Symptoms most likely concerning for anal fissure versus hemorrhoid.  Patient was offered anoscopy and respectfully declined. -Patient prescribed MiraLAX, instructed to titrate meds and so that she can have 1-2 soft stools daily.  Instructed to temporarily stop and then reduce MiraLAX if she develops diarrhea.

## 2019-11-19 NOTE — Assessment & Plan Note (Signed)
Checking thyroid panel today -We will contact patient with results

## 2019-11-19 NOTE — Assessment & Plan Note (Signed)
Patient symptoms concerning for acid reflux. -Prescribed famotidine to be taken daily -Instructed patient to avoid eating and then lying down -Instructed patient to avoid foods that trigger acid reflux, including spicy, fried, and other acidic foods.

## 2019-11-19 NOTE — Assessment & Plan Note (Signed)
Upon close examination patient does not have any concerning signs or evidence for infection of her teeth. -Patient instructed to follow-up with her dentist, already has appointment for February 16 for extraction of teeth -Patient was informed she can continue to swish salt water to decrease infection, however to avoid very hot or very cold temperatures to prevent further damage and decrease pain -Patient offered Tylenol and Advil to be taken together to control her pain, but stated she cannot take these meds because they cause her to have ?Seizures (patient states when she takes them she "starts to shake real bad").

## 2019-11-19 NOTE — Assessment & Plan Note (Signed)
Nodule of chest appears to be producing sort of caseous discharge, no purulent discharge appreciated on today's exam. -Nodule very tender to touch, patient would most likely benefit from incision and drainage of superficial nodule under local anesthetic -Patient informed she could take Tylenol and ibuprofen together for pain control, but states she cannot take these as they "cause her to have seizures".

## 2019-12-05 ENCOUNTER — Ambulatory Visit: Payer: Medicaid Other | Admitting: Family Medicine

## 2019-12-05 NOTE — Progress Notes (Deleted)
    SUBJECTIVE:   CHIEF COMPLAINT / HPI: Pap smear, breast tenderness, nodule on chest  HPI: Marie Parsons is a pleasant 54 year old female presenting to clinic today with concern for ***.   "Per last appt on 11/15/2019: Knot on chest: patient has a firm, painful knot on her chest located between her breasts. She has had this knot for almost over a year, states it has slowly gotten bigger. Sometimes when she applies pressure it will excrete thick yellow material.  She has not tried anything that makes her symptoms better or worse.  Denies bleeding, purulent drainage, fevers, body aches."  Pap smear: last pap smear was 05/15/2009 "NEGATIVE FOR INTRAEPITHELIAL LESIONS OR MALIGNANCY.Since no atypia is seen, HPV will not be performed unless otherwise specified by the clinician." No HPV was performed.    PERTINENT  PMH / PSH: Graves' disease, seizure disorder, sickle cell trait, asthma, and seasonal allergies  OBJECTIVE:   LMP 08/06/2016   Physical Exam: Gen: *** Cardiac: *** Resp: *** Breast: *** GU: ***    ASSESSMENT/PLAN:   No problem-specific Assessment & Plan notes found for this encounter. Chest nodule:  Pap smear:  Breast tenderness:    [place]

## 2020-01-05 ENCOUNTER — Encounter: Payer: Self-pay | Admitting: Family Medicine

## 2020-03-01 DIAGNOSIS — H2513 Age-related nuclear cataract, bilateral: Secondary | ICD-10-CM | POA: Diagnosis not present

## 2020-03-01 DIAGNOSIS — H01135 Eczematous dermatitis of left lower eyelid: Secondary | ICD-10-CM | POA: Diagnosis not present

## 2020-03-01 DIAGNOSIS — H01132 Eczematous dermatitis of right lower eyelid: Secondary | ICD-10-CM | POA: Diagnosis not present

## 2020-03-01 DIAGNOSIS — H01131 Eczematous dermatitis of right upper eyelid: Secondary | ICD-10-CM | POA: Diagnosis not present

## 2020-03-01 DIAGNOSIS — H5213 Myopia, bilateral: Secondary | ICD-10-CM | POA: Diagnosis not present

## 2020-03-01 DIAGNOSIS — H01134 Eczematous dermatitis of left upper eyelid: Secondary | ICD-10-CM | POA: Diagnosis not present

## 2020-05-22 DIAGNOSIS — H524 Presbyopia: Secondary | ICD-10-CM | POA: Diagnosis not present

## 2020-06-08 ENCOUNTER — Ambulatory Visit (INDEPENDENT_AMBULATORY_CARE_PROVIDER_SITE_OTHER): Payer: Medicaid Other | Admitting: Family Medicine

## 2020-06-08 DIAGNOSIS — Z01419 Encounter for gynecological examination (general) (routine) without abnormal findings: Secondary | ICD-10-CM | POA: Insufficient documentation

## 2020-06-08 DIAGNOSIS — Z91199 Patient's noncompliance with other medical treatment and regimen due to unspecified reason: Secondary | ICD-10-CM | POA: Insufficient documentation

## 2020-06-08 DIAGNOSIS — Z5329 Procedure and treatment not carried out because of patient's decision for other reasons: Secondary | ICD-10-CM

## 2020-06-08 NOTE — Progress Notes (Signed)
Patient no-show for appointment today.  Is due for Pap smear and mammogram, as well as colonoscopy, flu shot, Covid vaccine.    Milus Banister, [place], PGY-3 06/08/2020 1:41 PM

## 2020-07-23 ENCOUNTER — Encounter: Payer: Self-pay | Admitting: Student in an Organized Health Care Education/Training Program

## 2020-07-23 ENCOUNTER — Other Ambulatory Visit: Payer: Self-pay

## 2020-07-23 ENCOUNTER — Ambulatory Visit (INDEPENDENT_AMBULATORY_CARE_PROVIDER_SITE_OTHER): Payer: Medicaid Other | Admitting: Student in an Organized Health Care Education/Training Program

## 2020-07-23 ENCOUNTER — Encounter (HOSPITAL_COMMUNITY): Payer: Self-pay

## 2020-07-23 ENCOUNTER — Emergency Department (HOSPITAL_COMMUNITY)
Admission: EM | Admit: 2020-07-23 | Discharge: 2020-07-23 | Disposition: A | Discharge status: Home / Self Care | Payer: Medicaid Other | Attending: Emergency Medicine | Admitting: Emergency Medicine

## 2020-07-23 DIAGNOSIS — L02213 Cutaneous abscess of chest wall: Secondary | ICD-10-CM | POA: Diagnosis not present

## 2020-07-23 DIAGNOSIS — N764 Abscess of vulva: Secondary | ICD-10-CM | POA: Insufficient documentation

## 2020-07-23 DIAGNOSIS — Z5321 Procedure and treatment not carried out due to patient leaving prior to being seen by health care provider: Secondary | ICD-10-CM | POA: Insufficient documentation

## 2020-07-23 DIAGNOSIS — L0291 Cutaneous abscess, unspecified: Secondary | ICD-10-CM | POA: Diagnosis not present

## 2020-07-23 DIAGNOSIS — R222 Localized swelling, mass and lump, trunk: Secondary | ICD-10-CM

## 2020-07-23 MED ORDER — CEPHALEXIN 500 MG PO CAPS: 500.0000 mg | ORAL_CAPSULE | Freq: Four times a day (QID) | ORAL | 0 refills | Status: AC

## 2020-07-23 MED ORDER — DOXYCYCLINE HYCLATE 100 MG PO TABS: 100.0000 mg | ORAL_TABLET | Freq: Two times a day (BID) | ORAL | 0 refills | Status: AC

## 2020-07-23 NOTE — Patient Instructions (Signed)
It was a pleasure to see you today!  To summarize our discussion for this visit:  I HIGHLY recommend we drain your infection today but you refused after a long discussion on the reason to do it and the possible complications of not doing it today. So,   I have sent two antibiotics to your pharmacy. Take them as the instructions on the bottle tell you.  Return in 2 days for another appointment to evaluate your infection. If it is worsened, they will need to drain the abscess.   If you have any fevers or worsening pain in these 2 days, go to the Emergency department to have treatment  Some additional health maintenance measures we should update are: Health Maintenance Due  Topic Date Due   COVID-19 Vaccine (1) Never done   PAP SMEAR-Modifier  Never done   MAMMOGRAM  03/11/2014   COLONOSCOPY  Never done   INFLUENZA VACCINE  05/06/2020      Call the clinic at [phone] if your symptoms worsen or you have any concerns.   Thank you for allowing me to take part in your care,  Dr. Doristine Mango

## 2020-07-23 NOTE — ED Notes (Signed)
No answer in lobby x 3

## 2020-07-23 NOTE — ED Triage Notes (Signed)
Pt reports small abscess to middle of chest and left upper vaginal area with some drainage

## 2020-07-23 NOTE — Assessment & Plan Note (Signed)
Left inguinal fold. Very concerning appearance. Highly recommended I&D and patient consented but after sterile prep, patient declined. Spent >27min discussing the indications, complications, alternatives to doing the procedure and possible consequences of not draining it. Patient is in so much pain it is difficult for her to walk and she seems confused. I'm unsure if that mental state is her baseline or possible sign of systemic infection. Despite prolonged discussion and urging, patient refused I&D due to fear of pain of procedure.  Compromised with patient that I would prescribe antibiotics and have close follow up. - doxycycline + keflex x7 days.  - return visit in 2 days for reevaluation and if it is worse that she will get it drained at that time.  - in the meantime, if she has unbearable pain, spreading of the infection, or fever, she is to go to the emergency department immediately.  - patient endorses understanding and agreement with this plan.

## 2020-07-23 NOTE — Progress Notes (Signed)
    SUBJECTIVE:   CHIEF COMPLAINT / HPI: bump on chest and groin  Bump on chest- chronic. Has been present since about a year and will drain frequently in the shower. Mildly painful.  Bump on left inguinal area- progressively worsening over the past week. Denies previous occurences. Patient seems very confused and tired during questioning. She went to the ED today and was not seen. Denies having a fever. Very painful and effects walking.   OBJECTIVE:   BP 124/80   Pulse 93   Wt 216 lb 3.2 oz (98.1 kg)   LMP 08/06/2016   SpO2 97%   BMI 37.11 kg/m   General: anxious, tired Skin: warm and dry Midline furuncle on chest, non-erythematous, mildly tender to palpation, firm. Unchanged from pictures in chart from 11/2019. Left inguinal abscess about 4cm in diameter with tracking of induration along inguinal fold in both directions. Increased erythema, edema, extreme tenderness, increased warmth. No drainage.  Neuro: alert and oriented, no focal deficits Psych: anxious affect and mood  ASSESSMENT/PLAN:   Nodule of anterior chest wall Unchanged from previous.  Offered to I&D but patient refused. Continue warm compresses  Abscess Left inguinal fold. Very concerning appearance. Highly recommended I&D and patient consented but after sterile prep, patient declined. Spent >29min discussing the indications, complications, alternatives to doing the procedure and possible consequences of not draining it. Patient is in so much pain it is difficult for her to walk and she seems confused. I'm unsure if that mental state is her baseline or possible sign of systemic infection. Despite prolonged discussion and urging, patient refused I&D due to fear of pain of procedure.  Compromised with patient that I would prescribe antibiotics and have close follow up. - doxycycline + keflex x7 days.  - return visit in 2 days for reevaluation and if it is worse that she will get it drained at that time.  - in the  meantime, if she has unbearable pain, spreading of the infection, or fever, she is to go to the emergency department immediately.  - patient endorses understanding and agreement with this plan.     [place]

## 2020-07-23 NOTE — Assessment & Plan Note (Signed)
Unchanged from previous.  Offered to I&D but patient refused. Continue warm compresses

## 2020-07-26 ENCOUNTER — Ambulatory Visit: Payer: Medicaid Other

## 2021-05-29 DIAGNOSIS — Z1152 Encounter for screening for COVID-19: Secondary | ICD-10-CM | POA: Diagnosis not present

## 2022-08-17 ENCOUNTER — Emergency Department (HOSPITAL_COMMUNITY)
Admission: EM | Admit: 2022-08-17 | Discharge: 2022-08-18 | Disposition: A | Discharge status: Home / Self Care | Payer: Medicaid Other | Attending: Emergency Medicine | Admitting: Emergency Medicine

## 2022-08-17 ENCOUNTER — Other Ambulatory Visit: Payer: Self-pay

## 2022-08-17 ENCOUNTER — Encounter (HOSPITAL_COMMUNITY): Payer: Self-pay | Admitting: Pharmacy Technician

## 2022-08-17 DIAGNOSIS — K0889 Other specified disorders of teeth and supporting structures: Secondary | ICD-10-CM | POA: Diagnosis not present

## 2022-08-17 DIAGNOSIS — Z91010 Allergy to peanuts: Secondary | ICD-10-CM | POA: Diagnosis not present

## 2022-08-17 NOTE — ED Triage Notes (Signed)
Pt with complaints of pain/swelling in L upper mouth onset yesterday. Denies fevers.

## 2022-08-17 NOTE — ED Provider Triage Note (Signed)
Emergency Medicine Provider Triage Evaluation Note  Marie Parsons , a 56 y.o. female  was evaluated in triage.  Pt complains of dental pain.  Pain to left upper dentition.  No facial swelling, fever. Apt with dentist on Thursday  Review of Systems  Positive: Dental pain Negative: Fever, facial swelling  Physical Exam  LMP 08/06/2016  Gen:   Awake, no distress   Mouth:  Poor dentition, multiple missing teeth Resp:  Normal effort  MSK:   Moves extremities without difficulty  Other:    Medical Decision Making  Medically screening exam initiated at 3:27 PM.  Appropriate orders placed.  Marie Parsons was informed that the remainder of the evaluation will be completed by another provider, this initial triage assessment does not replace that evaluation, and the importance of remaining in the ED until their evaluation is complete.  Dental pain   Mayreli Alden A, PA-C 08/17/22 1528

## 2022-08-18 MED ORDER — PENICILLIN V POTASSIUM 500 MG PO TABS: 500.0000 mg | ORAL_TABLET | Freq: Four times a day (QID) | ORAL | 0 refills | Status: AC

## 2022-08-18 NOTE — ED Provider Notes (Signed)
[facility]PART[facility] Provider Note   CSN: [id number] Arrival date & time: 08/17/22  1522     History  Chief Complaint  Patient presents with   Dental Pain    Marie Parsons is a 56 y.o. female.  The history is provided by the patient and medical records.  Dental Pain  56 y.o. F here with left lower dental pain since yesterday.  Feels like she has an abscessed tooth.  She did call and get a dentist appointment for this Thursday.  When she woke up this morning she had a bit of facial swelling and was concerned.  She denies any fever or difficulty swallowing.  Home Medications Prior to Admission medications   Medication Sig Start Date End Date Taking? Authorizing Provider  Calcium Citrate 250 MG TABS Take 2 tablets (500 mg total) by mouth daily. 01/15/18   Diallo, Earna Coder, MD  cetirizine (ZYRTEC) 10 MG tablet Take 1 tablet (10 mg total) by mouth daily. 04/20/19   Daisy Floro, DO  famotidine (PEPCID) 10 MG tablet Take 1 tablet (10 mg total) by mouth daily. 11/15/19   Daisy Floro, DO  fluticasone (FLONASE) 50 MCG/ACT nasal spray Place 2 sprays into both nostrils daily. 04/20/19   Daisy Floro, DO  polyethylene glycol powder (GLYCOLAX/MIRALAX) 17 GM/SCOOP powder Take 17 g by mouth 2 (two) times daily as needed. 04/20/19   Daisy Floro, DO  polyethylene glycol powder (GLYCOLAX/MIRALAX) 17 GM/SCOOP powder Take 17 g by mouth 2 (two) times daily as needed. 11/15/19   Daisy Floro, DO  propranolol (INDERAL) 60 MG tablet Take 2 tablets (120 mg total) by mouth daily. 01/15/18   Diallo, Earna Coder, MD  Vitamin D, Cholecalciferol, 400 units TABS Take 400 mg by mouth 2 (two) times daily. 01/15/18   Diallo, Earna Coder, MD      Allergies    Acetaminophen, Aspirin, Fish allergy, Ibuprofen, and Peanut-containing drug products    Review of Systems   Review of Systems  HENT:  Positive for dental problem.   All other systems reviewed and are  negative.   Physical Exam Updated Vital Signs BP 112/85   Pulse (!) 59   Temp 98 F (36.7 C)   Resp 16   LMP 08/06/2016   SpO2 96%   Physical Exam Vitals and nursing note reviewed.  Constitutional:      Appearance: She is well-developed.  HENT:     Head: Normocephalic and atraumatic.     Mouth/Throat:     Comments: Teeth largely in fair dentition, several left lower teeth missing, gums swollen without fluid collection or abscess, handling secretions appropriately, no trismus, minimal swelling to left lower cheek near the chin without extension into neck, normal phonation, no stridor Eyes:     Conjunctiva/sclera: Conjunctivae normal.     Pupils: Pupils are equal, round, and reactive to light.  Cardiovascular:     Rate and Rhythm: Normal rate and regular rhythm.     Heart sounds: Normal heart sounds.  Pulmonary:     Effort: Pulmonary effort is normal.     Breath sounds: Normal breath sounds.  Abdominal:     General: Bowel sounds are normal.     Palpations: Abdomen is soft.  Musculoskeletal:        General: Normal range of motion.     Cervical back: Normal range of motion.  Skin:    General: Skin is warm and dry.  Neurological:     Mental Status:  She is alert and oriented to person, place, and time.     ED Results / Procedures / Treatments   Labs (all labs ordered are listed, but only abnormal results are displayed) Labs Reviewed - No data to display  EKG None  Radiology No results found.  Procedures Procedures    Medications Ordered in ED Medications - No data to display  ED Course/ Medical Decision Making/ A&P                           Medical Decision Making Risk Prescription drug management.   56 year old female here with left lower dental pain since yesterday.  She denies any fever.  She is afebrile and nontoxic here.  Does have apparent infection to left lower gums, multiple teeth are absent.  There is no discrete fluid collection or drainable  abscess.  Mild swelling to left lower cheek without extension into the neck.  Handling secretions well, no stridor.  Not clinically concerning for Ludwig's angina.  Will start on antibiotics and have her follow-up with her dentist on Thursday as scheduled.  Can return here for new concerns.  Final Clinical Impression(s) / ED Diagnoses Final diagnoses:  Pain, dental    Rx / DC Orders ED Discharge Orders          Ordered    penicillin v potassium (VEETID) 500 MG tablet  4 times daily        08/18/22 0151              Larene Pickett, PA-C 08/18/22 0235    Merrily Pew, MD 08/18/22 [phone]

## 2022-08-18 NOTE — Discharge Instructions (Signed)
Take the prescribed medication as directed. Follow-up with your dentist on Thursday as scheduled. Return to the ED for new or worsening symptoms.

## 2023-10-25 ENCOUNTER — Emergency Department (HOSPITAL_COMMUNITY): Payer: Medicaid Other

## 2023-10-25 ENCOUNTER — Emergency Department (HOSPITAL_COMMUNITY)
Admission: EM | Admit: 2023-10-25 | Discharge: 2023-10-26 | Disposition: A | Discharge status: Home / Self Care | Payer: Medicaid Other | Attending: Emergency Medicine | Admitting: Emergency Medicine

## 2023-10-25 ENCOUNTER — Other Ambulatory Visit: Payer: Self-pay

## 2023-10-25 ENCOUNTER — Encounter (HOSPITAL_COMMUNITY): Payer: Self-pay

## 2023-10-25 DIAGNOSIS — R059 Cough, unspecified: Secondary | ICD-10-CM | POA: Diagnosis present

## 2023-10-25 DIAGNOSIS — K2100 Gastro-esophageal reflux disease with esophagitis, without bleeding: Secondary | ICD-10-CM | POA: Insufficient documentation

## 2023-10-25 DIAGNOSIS — R0789 Other chest pain: Secondary | ICD-10-CM | POA: Diagnosis not present

## 2023-10-25 DIAGNOSIS — J45909 Unspecified asthma, uncomplicated: Secondary | ICD-10-CM | POA: Insufficient documentation

## 2023-10-25 DIAGNOSIS — R079 Chest pain, unspecified: Secondary | ICD-10-CM | POA: Diagnosis not present

## 2023-10-25 DIAGNOSIS — Z20822 Contact with and (suspected) exposure to covid-19: Secondary | ICD-10-CM | POA: Insufficient documentation

## 2023-10-25 DIAGNOSIS — J101 Influenza due to other identified influenza virus with other respiratory manifestations: Secondary | ICD-10-CM | POA: Diagnosis not present

## 2023-10-25 DIAGNOSIS — I700 Atherosclerosis of aorta: Secondary | ICD-10-CM | POA: Diagnosis not present

## 2023-10-25 LAB — BASIC METABOLIC PANEL
Anion gap: 11 (ref 5–15)
BUN: <5 mg/dL — ABNORMAL LOW (ref 6–20)
CO2: 24 mmol/L (ref 22–32)
Calcium: 8.9 mg/dL (ref 8.9–10.3)
Chloride: 101 mmol/L (ref 98–111)
Creatinine, Ser: 0.86 mg/dL (ref 0.44–1.00)
GFR, Estimated: >60 mL/min (ref >60)
Glucose, Bld: 102 mg/dL — ABNORMAL HIGH (ref 70–99)
Potassium: 3.2 mmol/L — ABNORMAL LOW (ref 3.5–5.1)
Sodium: 136 mmol/L (ref 135–145)

## 2023-10-25 LAB — CBC
HCT: 41.7 % (ref 36.0–46.0)
Hemoglobin: 14.3 g/dL (ref 12.0–15.0)
MCH: 31.8 pg (ref 26.0–34.0)
MCHC: 34.3 g/dL (ref 30.0–36.0)
MCV: 92.9 fL (ref 80.0–100.0)
Platelets: 231 10*3/uL (ref 150–400)
RBC: 4.49 MIL/uL (ref 3.87–5.11)
RDW: 13 % (ref 11.5–15.5)
WBC: 4.6 10*3/uL (ref 4.0–10.5)
nRBC: 0 % (ref 0.0–0.2)

## 2023-10-25 LAB — TROPONIN I (HIGH SENSITIVITY)
Troponin I (High Sensitivity): 6 ng/L (ref <18)
Troponin I (High Sensitivity): 6 ng/L (ref <18)

## 2023-10-25 NOTE — ED Triage Notes (Signed)
Pt reports central chest pain x1week and intermittent seizures x2 weeks. Pt states that she has a h/x seizures but ran out of her meds. Pt A&Ox4

## 2023-10-26 LAB — RESP PANEL BY RT-PCR (RSV, FLU A&B, COVID)  RVPGX2
Influenza A by PCR: POSITIVE — AB
Influenza B by PCR: NEGATIVE
Resp Syncytial Virus by PCR: NEGATIVE
SARS Coronavirus 2 by RT PCR: NEGATIVE

## 2023-10-26 MED ORDER — POTASSIUM CHLORIDE CRYS ER 20 MEQ PO TBCR
40.0000 meq | EXTENDED_RELEASE_TABLET | Freq: Once | ORAL | Status: AC
  Administered 2023-10-26: 40 meq via ORAL
  Filled 2023-10-26: qty 2

## 2023-10-26 MED ORDER — BENZONATATE 100 MG PO CAPS
100.0000 mg | ORAL_CAPSULE | Freq: Once | ORAL | Status: AC
  Administered 2023-10-26: 100 mg via ORAL
  Filled 2023-10-26: qty 1

## 2023-10-26 MED ORDER — NAPROXEN 250 MG PO TABS
500.0000 mg | ORAL_TABLET | Freq: Once | ORAL | Status: AC
  Administered 2023-10-26: 500 mg via ORAL
  Filled 2023-10-26: qty 2

## 2023-10-26 MED ORDER — FAMOTIDINE 10 MG PO TABS: 10.0000 mg | ORAL_TABLET | Freq: Every day | ORAL | 3 refills | Status: AC

## 2023-10-26 MED ORDER — NAPROXEN 500 MG PO TABS: 500.0000 mg | ORAL_TABLET | Freq: Two times a day (BID) | ORAL | 0 refills | Status: DC

## 2023-10-26 MED ORDER — OSELTAMIVIR PHOSPHATE 75 MG PO CAPS
75.0000 mg | ORAL_CAPSULE | Freq: Once | ORAL | Status: AC
  Administered 2023-10-26: 75 mg via ORAL
  Filled 2023-10-26: qty 1

## 2023-10-26 MED ORDER — CETIRIZINE HCL 10 MG PO TABS: 10.0000 mg | ORAL_TABLET | Freq: Every day | ORAL | 3 refills | Status: AC

## 2023-10-26 MED ORDER — FLUTICASONE PROPIONATE 50 MCG/ACT NA SUSP: 2.0000 | Freq: Every day | NASAL | 6 refills | Status: AC

## 2023-10-26 MED ORDER — OXYCODONE HCL 5 MG PO TABS
5.0000 mg | ORAL_TABLET | Freq: Once | ORAL | Status: AC
  Administered 2023-10-26: 5 mg via ORAL
  Filled 2023-10-26: qty 1

## 2023-10-26 MED ORDER — BENZONATATE 100 MG PO CAPS: 100.0000 mg | ORAL_CAPSULE | Freq: Two times a day (BID) | ORAL | 0 refills | Status: AC | PRN

## 2023-10-26 MED ORDER — OSELTAMIVIR PHOSPHATE 75 MG PO CAPS: 75.0000 mg | ORAL_CAPSULE | Freq: Two times a day (BID) | ORAL | 0 refills | Status: DC

## 2023-10-26 NOTE — ED Notes (Signed)
Pt verbalized understanding of discharge instructions. Pt ambulatory at time of discharge. Pt wheeled from ed daughter is on her way to pick her up.

## 2023-10-26 NOTE — ED Provider Notes (Signed)
[facility] Emergency Department Provider Note MRN:  [id number]  Arrival date & time: 10/26/23     Chief Complaint   Chest Pain and Seizures   History of Present Illness   Marie Parsons is a 58 y.o. year-old female presents to the ED with chief complaint of cough and generalized bodyaches.  She is noted to have low-grade temperature here in triage at 100.8 degrees.  She denies any successful treatments prior to arrival.  She states that her symptoms started about a week ago.  She reports no sick contacts.  Additionally, patient states that she is out of her medications.  She reports that she has history of seizure disorder and thinks she might of had seizures earlier this week.Marland Kitchen  History provided by patient.   Review of Systems  Pertinent positive and negative review of systems noted in HPI.    Physical Exam   Vitals:   10/26/23 0245 10/26/23 0345  BP:    Pulse: 82 91  Resp: (!) 28 (!) 28  Temp:    SpO2: 92% 91%    CONSTITUTIONAL:  non toxic-appearing, NAD NEURO:  Alert and oriented x 3, CN 3-12 grossly intact EYES:  eyes equal and reactive ENT/NECK:  Supple, no stridor  CARDIO:  tachycardic, regular rhythm, appears well-perfused  PULM:  No respiratory distress, CTAB GI/GU:  non-distended,  MSK/SPINE:  No gross deformities, no edema, moves all extremities  SKIN:  no rash, atraumatic   *Additional and/or pertinent findings included in MDM below  Diagnostic and Interventional Summary    EKG Interpretation Date/Time:    Ventricular Rate:    PR Interval:    QRS Duration:    QT Interval:    QTC Calculation:   R Axis:      Text Interpretation:         Labs Reviewed  RESP PANEL BY RT-PCR (RSV, FLU A&B, COVID)  RVPGX2 - Abnormal; Notable for the following components:      Result Value   Influenza A by PCR POSITIVE (*)    All other components within normal limits  BASIC METABOLIC PANEL - Abnormal; Notable for the following  components:   Potassium 3.2 (*)    Glucose, Bld 102 (*)    BUN <5 (*)    All other components within normal limits  CBC  TROPONIN I (HIGH SENSITIVITY)  TROPONIN I (HIGH SENSITIVITY)    DG Chest 2 View  Final Result      Medications  naproxen (NAPROSYN) tablet 500 mg (has no administration in time range)  benzonatate (TESSALON) capsule 100 mg (has no administration in time range)  oseltamivir (TAMIFLU) capsule 75 mg (has no administration in time range)  potassium chloride SA (KLOR-CON M) CR tablet 40 mEq (40 mEq Oral Given 10/26/23 0208)  oxyCODONE (Oxy IR/ROXICODONE) immediate release tablet 5 mg (5 mg Oral Given 10/26/23 0208)     Procedures  /  Critical Care Procedures  ED Course and Medical Decision Making  I have reviewed the triage vital signs, the nursing notes, and pertinent available records from the EMR.  Social Determinants Affecting Complexity of Care: Patient has no clinically significant social determinants affecting this chief complaint..   ED Course:    Medical Decision Making Patient here with generalized bodyaches and low-grade fever and cough.  Symptoms are consistent with flulike illness.  Will check COVID and flu.  She states that she has chest pain when she is coughing.  Will prescribe cough medicine.  Troponins  are negative, doubt ACS.  No significant leukocytosis or anemia.  Potassium noted to be a little low at 3.2, this was repleted in the ED.  Influenza A test positive, will treat with Tamiflu and will give Tessalon Perles.  Patient asked for refill of her regular medications.  I do not see that she has been seen by her primary care doctor in a few years.  I will refill the medications that are listed on her chart.  I do not see any seizure medications.  I have referred her back to her primary care doctor.    Dr. Wilkie Aye and I discussed starting Keppra, but patient doesn't have an EEG in the chart and there are not any prior visits of patient being on  seizure medications in her PCP notes.  I'll defer this to PCP, since it's unclear if this is an accurate diagnosis.  No seizure like activity in the ED.  Patient isn't post-ictal.  Feel that she is stable for discharge and outpatient follow-up.  Amount and/or Complexity of Data Reviewed Labs: ordered. Radiology: ordered.  Risk OTC drugs. Prescription drug management.         Consultants: No consultations were needed in caring for this patient.   Treatment and Plan: I considered admission due to patient's initial presentation, but after considering the examination and diagnostic results, patient will not require admission and can be discharged with outpatient follow-up.  Patient discussed with attending physician, Dr. Wilkie Aye, who agrees with plan for outpatient follow-up.  Final Clinical Impressions(s) / ED Diagnoses     ICD-10-CM   1. Influenza A  J10.1     2. Asthma due to seasonal allergies  J45.909 cetirizine (ZYRTEC) 10 MG tablet    fluticasone (FLONASE) 50 MCG/ACT nasal spray    3. Gastroesophageal reflux disease with esophagitis without hemorrhage  K21.00 famotidine (PEPCID) 10 MG tablet      ED Discharge Orders          Ordered    oseltamivir (TAMIFLU) 75 MG capsule  Every 12 hours        10/26/23 0355    benzonatate (TESSALON) 100 MG capsule  2 times daily PRN        10/26/23 0355    naproxen (NAPROSYN) 500 MG tablet  2 times daily        10/26/23 0355    cetirizine (ZYRTEC) 10 MG tablet  Daily        10/26/23 0355    famotidine (PEPCID) 10 MG tablet  Daily        10/26/23 0355    fluticasone (FLONASE) 50 MCG/ACT nasal spray  Daily        10/26/23 0355              Discharge Instructions Discussed with and Provided to Patient:   Discharge Instructions   None      Roxy Horseman, PA-C 10/26/23 0407    Shon Baton, MD 10/26/23 [phone]

## 2023-10-26 NOTE — ED Notes (Signed)
Pt ambulated to restroom with pulse ox. Pt maintained an oxygen level of 97% RA

## 2023-10-28 ENCOUNTER — Telehealth: Payer: Self-pay

## 2023-10-28 NOTE — Telephone Encounter (Signed)
Contacted the patient but her daughter answered I asked if she could inform her mother that we called and asked her to give [place] a call back when she can.

## 2024-08-16 ENCOUNTER — Observation Stay (HOSPITAL_COMMUNITY)
Admission: EM | Admit: 2024-08-16 | Discharge: 2024-08-17 | Disposition: A | Discharge status: Home / Self Care | Attending: Family Medicine | Admitting: Family Medicine

## 2024-08-16 ENCOUNTER — Other Ambulatory Visit: Payer: Self-pay

## 2024-08-16 ENCOUNTER — Emergency Department (HOSPITAL_COMMUNITY)

## 2024-08-16 ENCOUNTER — Encounter (HOSPITAL_COMMUNITY): Payer: Self-pay

## 2024-08-16 DIAGNOSIS — Z7982 Long term (current) use of aspirin: Secondary | ICD-10-CM | POA: Diagnosis not present

## 2024-08-16 DIAGNOSIS — R109 Unspecified abdominal pain: Secondary | ICD-10-CM

## 2024-08-16 DIAGNOSIS — J029 Acute pharyngitis, unspecified: Secondary | ICD-10-CM | POA: Diagnosis present

## 2024-08-16 DIAGNOSIS — R519 Headache, unspecified: Secondary | ICD-10-CM | POA: Insufficient documentation

## 2024-08-16 DIAGNOSIS — R29898 Other symptoms and signs involving the musculoskeletal system: Secondary | ICD-10-CM

## 2024-08-16 DIAGNOSIS — Z789 Other specified health status: Secondary | ICD-10-CM

## 2024-08-16 DIAGNOSIS — R509 Fever, unspecified: Secondary | ICD-10-CM | POA: Diagnosis not present

## 2024-08-16 DIAGNOSIS — J019 Acute sinusitis, unspecified: Secondary | ICD-10-CM | POA: Diagnosis not present

## 2024-08-16 DIAGNOSIS — K219 Gastro-esophageal reflux disease without esophagitis: Secondary | ICD-10-CM | POA: Diagnosis not present

## 2024-08-16 LAB — COMPREHENSIVE METABOLIC PANEL WITH GFR
ALT: 32 U/L (ref 0–44)
AST: 34 U/L (ref 15–41)
Albumin: 3.4 g/dL — ABNORMAL LOW (ref 3.5–5.0)
Alkaline Phosphatase: 98 U/L (ref 38–126)
Anion gap: 11 (ref 5–15)
BUN: 9 mg/dL (ref 6–20)
CO2: 24 mmol/L (ref 22–32)
Calcium: 8.6 mg/dL — ABNORMAL LOW (ref 8.9–10.3)
Chloride: 99 mmol/L (ref 98–111)
Creatinine, Ser: 0.75 mg/dL (ref 0.44–1.00)
GFR, Estimated: >60 mL/min (ref >60)
Glucose, Bld: 108 mg/dL — ABNORMAL HIGH (ref 70–99)
Potassium: 3.6 mmol/L (ref 3.5–5.1)
Sodium: 134 mmol/L — ABNORMAL LOW (ref 135–145)
Total Bilirubin: 1 mg/dL (ref 0.0–1.2)
Total Protein: 8.1 g/dL (ref 6.5–8.1)

## 2024-08-16 LAB — I-STAT CG4 LACTIC ACID, ED: Lactic Acid, Venous: 0.9 mmol/L (ref 0.5–1.9)

## 2024-08-16 LAB — URINALYSIS, ROUTINE W REFLEX MICROSCOPIC
Bilirubin Urine: NEGATIVE
Glucose, UA: NEGATIVE mg/dL
Ketones, ur: NEGATIVE mg/dL
Nitrite: NEGATIVE
Protein, ur: 100 mg/dL — AB
Specific Gravity, Urine: 1.019 (ref 1.005–1.030)
pH: 5 (ref 5.0–8.0)

## 2024-08-16 LAB — CBC
HCT: 41.8 % (ref 36.0–46.0)
Hemoglobin: 14.6 g/dL (ref 12.0–15.0)
MCH: 32.3 pg (ref 26.0–34.0)
MCHC: 34.9 g/dL (ref 30.0–36.0)
MCV: 92.5 fL (ref 80.0–100.0)
Platelets: 269 K/uL (ref 150–400)
RBC: 4.52 MIL/uL (ref 3.87–5.11)
RDW: 12.9 % (ref 11.5–15.5)
WBC: 15 K/uL — ABNORMAL HIGH (ref 4.0–10.5)
nRBC: 0 % (ref 0.0–0.2)

## 2024-08-16 LAB — RESP PANEL BY RT-PCR (RSV, FLU A&B, COVID)  RVPGX2
Influenza A by PCR: NEGATIVE
Influenza B by PCR: NEGATIVE
Resp Syncytial Virus by PCR: NEGATIVE
SARS Coronavirus 2 by RT PCR: NEGATIVE

## 2024-08-16 LAB — CBG MONITORING, ED: Glucose-Capillary: 108 mg/dL — ABNORMAL HIGH (ref 70–99)

## 2024-08-16 LAB — RETICULOCYTES
Immature Retic Fract: 7.8 % (ref 2.3–15.9)
RBC.: 4.55 MIL/uL (ref 3.87–5.11)
Retic Count, Absolute: 57.3 K/uL (ref 19.0–186.0)
Retic Ct Pct: 1.3 % (ref 0.4–3.1)

## 2024-08-16 LAB — TROPONIN I (HIGH SENSITIVITY)
Troponin I (High Sensitivity): 5 ng/L (ref <18)
Troponin I (High Sensitivity): 3 ng/L (ref <18)

## 2024-08-16 LAB — GROUP A STREP BY PCR: Group A Strep by PCR: NOT DETECTED

## 2024-08-16 MED ORDER — SODIUM CHLORIDE 0.9 % IV SOLN
2.0000 g | Freq: Once | INTRAVENOUS | Status: AC
  Administered 2024-08-16: 2 g via INTRAVENOUS
  Filled 2024-08-16: qty 20

## 2024-08-16 MED ORDER — SODIUM CHLORIDE 0.9 % IV BOLUS
1000.0000 mL | Freq: Once | INTRAVENOUS | Status: AC
  Administered 2024-08-16: 1000 mL via INTRAVENOUS

## 2024-08-16 MED ORDER — SODIUM CHLORIDE 0.9 % IV SOLN
500.0000 mg | Freq: Once | INTRAVENOUS | Status: AC
  Administered 2024-08-16: 500 mg via INTRAVENOUS
  Filled 2024-08-16: qty 5

## 2024-08-16 MED ORDER — IOHEXOL 350 MG/ML SOLN
75.0000 mL | Freq: Once | INTRAVENOUS | Status: AC | PRN
  Administered 2024-08-16: 75 mL via INTRAVENOUS

## 2024-08-16 MED ORDER — VANCOMYCIN HCL 1750 MG/350ML IV SOLN
1750.0000 mg | Freq: Once | INTRAVENOUS | Status: AC
  Administered 2024-08-17: 1750 mg via INTRAVENOUS
  Filled 2024-08-16: qty 350

## 2024-08-16 MED ORDER — MORPHINE SULFATE (PF) 4 MG/ML IV SOLN: 4.0000 mg | Freq: Once | INTRAVENOUS | Status: DC

## 2024-08-16 NOTE — ED Notes (Signed)
 Lab was called, will add on reticulocytes to tubes in lab

## 2024-08-16 NOTE — ED Triage Notes (Signed)
 Pt c/o headache, similar to sickle cell pain in past. Pt has had vomiting, states there is bright red blood in her vomit. Pt denies numbness or tingling. Pt c/o weakness in left arm that started yesterday afternoon around 1400.

## 2024-08-16 NOTE — ED Provider Notes (Incomplete)
  Physical Exam  BP (!) 154/81 (BP Location: Right Arm)   Pulse 98   Temp 98.5 F (36.9 C) (Oral)   Resp 18   Ht 5' 4 (1.626 m)   Wt 81.6 kg   LMP 08/06/2016   SpO2 100%   BMI 30.90 kg/m   Physical Exam  Procedures  Procedures  ED Course / MDM   Clinical Course as of 08/16/24 1853  Tue Aug 16, 2024  1615 I, Ozell Marine DO, am transitioning care of this patient to the oncoming provider pending MRI brain CT abdomen pelvis reevaluation and disposition [MP]    Clinical Course User Index [MP] Marine Ozell LABOR, DO     58yo female with history of sickle cell trait presenting with headache, new onset LUE weakness, nausea and vomiting and LLQ abdominal pain.    LNW prior to [facility] yesterday  Labs completed without evidence of influenza, RSV, COVID, no signs of UTI, no signs of ACS, no clinically significant electrolyte abnormalities.  Leukocytosis present.  MRI brain completed and shows no acute intracranial abnormalities, mild chronic small vessel ischemic disease.  CT head with chronic inflammatory change of the sinuses. CT abdomen pelvis shows diverticulosis without active inflammation.  There are left-sided renal cysts.  Stable small umbilical hernia containing only peritoneal fat.  On my reevaluation, she reports yesterday she suddenly felt feverish, coughed up blood, had dyspnea, nausea, vomiting.  She also reported headache when I reminded her of this, does not discuss other symptoms.  She reports she has sickle cell trait, NOT sickle cell disease.  She has now developed a fever and also reports sore throat.  In the setting of multiple other symptoms, negative kernigs/brudzinski's, normal mental status, do not feel LP indicated at this time.  GIven cough, hemoptysis, dyspnea, fever, will order CT PE study to evaluate for PE or an occult pneumonia.  Have also ordered a strep test.  Strep test

## 2024-08-16 NOTE — ED Notes (Signed)
 MRI messaged this RN and are sending for pt now

## 2024-08-16 NOTE — ED Notes (Signed)
 Pt to MRI

## 2024-08-16 NOTE — ED Provider Notes (Signed)
  Physical Exam  BP (!) 154/81 (BP Location: Right Arm)   Pulse 98   Temp 98.5 F (36.9 C) (Oral)   Resp 18   Ht 5' 4 (1.626 m)   Wt 81.6 kg   LMP 08/06/2016   SpO2 100%   BMI 30.90 kg/m   Physical Exam  Procedures  Procedures  ED Course / MDM   Clinical Course as of 08/16/24 1853  Tue Aug 16, 2024  1615 I, Ozell Marine DO, am transitioning care of this patient to the oncoming provider pending MRI brain CT abdomen pelvis reevaluation and disposition [MP]    Clinical Course User Index [MP] Marine Ozell LABOR, DO     58yo female with history of sickle cell trait presenting with headache, new onset LUE weakness, nausea and vomiting and LLQ abdominal pain.    LNW prior to [facility] yesterday  Labs completed without evidence of influenza, RSV, COVID, no signs of UTI, no signs of ACS, no clinically significant electrolyte abnormalities.  Leukocytosis present.  MRI brain completed and shows no acute intracranial abnormalities, mild chronic small vessel ischemic disease.  CT head with chronic inflammatory change of the sinuses. CT abdomen pelvis shows diverticulosis without active inflammation.  There are left-sided renal cysts.  Stable small umbilical hernia containing only peritoneal fat.  On my reevaluation, she reports yesterday she suddenly felt feverish, coughed up blood, had dyspnea, nausea, vomiting.  She also reported headache when I reminded her of this, does not discuss other symptoms.  She reports she has sickle cell trait, NOT sickle cell disease.  She has now developed a fever and also reports sore throat.  In the setting of multiple other symptoms, negative kernigs/brudzinski's, normal mental status, do not feel LP indicated at this time.  GIven cough, hemoptysis, dyspnea, fever, will order CT PE study to evaluate for PE or an occult pneumonia.  Have also ordered a strep test.  Strep test    Dreama Longs, MD 08/17/24 1322

## 2024-08-16 NOTE — ED Provider Notes (Signed)
 [place] [facility] EMERGENCY DEPARTMENT AT [facility] Provider Note   CSN: [id number] Arrival date & time: 08/16/24  1144     Patient presents with: Headache   Marie Parsons is a 58 y.o. female.  With a history of sickle cell anemia and GERD who presents to the ED with multiple complaints.  Patient reports acute onset headache around 1400 yesterday afternoon.  She did not seek medical evaluation at that time.  Around the same time she noticed weakness in the left arm that has persisted since the onset.  Nausea and vomiting with some blood reported in the vomiting during  the later episodes.  No frank hematemesis.  Endorses constipation.    Headache      Prior to Admission medications   Medication Sig Start Date End Date Taking? Authorizing Provider  benzonatate  (TESSALON ) 100 MG capsule Take 1 capsule (100 mg total) by mouth 2 (two) times daily as needed for cough. 10/26/23   Vicky Charleston, PA-C  Calcium  Citrate 250 MG TABS Take 2 tablets (500 mg total) by mouth daily. 01/15/18   Diallo, Irving, MD  cetirizine  (ZYRTEC ) 10 MG tablet Take 1 tablet (10 mg total) by mouth daily. 10/26/23   Vicky Charleston, PA-C  famotidine  (PEPCID ) 10 MG tablet Take 1 tablet (10 mg total) by mouth daily. 10/26/23   Vicky Charleston, PA-C  fluticasone  (FLONASE ) 50 MCG/ACT nasal spray Place 2 sprays into both nostrils daily. 10/26/23   Vicky Charleston, PA-C  naproxen  (NAPROSYN ) 500 MG tablet Take 1 tablet (500 mg total) by mouth 2 (two) times daily. 10/26/23   Vicky Charleston, PA-C  oseltamivir  (TAMIFLU ) 75 MG capsule Take 1 capsule (75 mg total) by mouth every 12 (twelve) hours. 10/26/23   Vicky Charleston, PA-C  polyethylene glycol powder (GLYCOLAX /MIRALAX ) 17 GM/SCOOP powder Take 17 g by mouth 2 (two) times daily as needed. 04/20/19   Lenon Chiquita BROCKS, DO  polyethylene glycol powder (GLYCOLAX /MIRALAX ) 17 GM/SCOOP powder Take 17 g by mouth 2 (two) times daily as needed. 11/15/19   Lenon Chiquita BROCKS, DO  propranolol  (INDERAL ) 60 MG tablet Take 2 tablets (120 mg total) by mouth daily. 01/15/18   Diallo, Irving, MD  Vitamin D , Cholecalciferol , 400 units TABS Take 400 mg by mouth 2 (two) times daily. 01/15/18   Diallo, Irving, MD    Allergies: Acetaminophen , Aspirin, Fish allergy, Ibuprofen, and Peanut-containing drug products    Review of Systems  Neurological:  Positive for headaches.    Updated Vital Signs BP (!) 154/81 (BP Location: Right Arm)   Pulse 98   Temp 98.5 F (36.9 C) (Oral)   Resp 18   Ht 5' 4 (1.626 m)   Wt 81.6 kg   LMP 08/06/2016   SpO2 100%   BMI 30.90 kg/m   Physical Exam Vitals and nursing note reviewed.  HENT:     Head: Normocephalic and atraumatic.  Eyes:     Pupils: Pupils are equal, round, and reactive to light.  Cardiovascular:     Rate and Rhythm: Normal rate and regular rhythm.  Pulmonary:     Effort: Pulmonary effort is normal.     Breath sounds: Normal breath sounds.  Abdominal:     Palpations: Abdomen is soft.     Tenderness: There is abdominal tenderness.     Comments: Left lower quadrant tenderness without rebound rigidity or guarding  Skin:    General: Skin is warm and dry.  Neurological:     Mental Status: She is alert.  Comments: 5 out of 5 motor strength bilateral lower extremities 5 out of 5 motor strength in right upper extremity 4 out of 5 motor strength with abduction and flexion of left upper extremity Diminished sensation to light touch in left hand compared to right hand Clear fluent speech Awake alert oriented x 4  Psychiatric:        Mood and Affect: Mood normal.     (all labs ordered are listed, but only abnormal results are displayed) Labs Reviewed  COMPREHENSIVE METABOLIC PANEL WITH GFR - Abnormal; Notable for the following components:      Result Value   Sodium 134 (*)    Glucose, Bld 108 (*)    Calcium  8.6 (*)    Albumin 3.4 (*)    All other components within normal limits  CBC - Abnormal;  Notable for the following components:   WBC 15.0 (*)    All other components within normal limits  URINALYSIS, ROUTINE W REFLEX MICROSCOPIC - Abnormal; Notable for the following components:   APPearance HAZY (*)    Hgb urine dipstick SMALL (*)    Protein, ur 100 (*)    Leukocytes,Ua MODERATE (*)    Bacteria, UA RARE (*)    All other components within normal limits  CBG MONITORING, ED - Abnormal; Notable for the following components:   Glucose-Capillary 108 (*)    All other components within normal limits  RESP PANEL BY RT-PCR (RSV, FLU A&B, COVID)  RVPGX2  RETICULOCYTES  TROPONIN I (HIGH SENSITIVITY)  TROPONIN I (HIGH SENSITIVITY)    EKG: None  Radiology: DG Chest 2 View Result Date: 08/16/2024 CLINICAL DATA:  Chest pain and shortness of breath. EXAM: CHEST - 2 VIEW COMPARISON:  None Available. FINDINGS: Lungs are adequately inflated without consolidation or effusion. Cardiomediastinal silhouette and remainder of the exam is unchanged. IMPRESSION: No active cardiopulmonary disease. Electronically Signed   By: Toribio Agreste M.D.   On: 08/16/2024 14:32   CT Head Wo Contrast Result Date: 08/16/2024 CLINICAL DATA:  History of sickle cell pain with headache. Left arm weakness. EXAM: CT HEAD WITHOUT CONTRAST TECHNIQUE: Contiguous axial images were obtained from the base of the skull through the vertex without intravenous contrast. RADIATION DOSE REDUCTION: This exam was performed according to the departmental dose-optimization program which includes automated exposure control, adjustment of the mA and/or kV according to patient size and/or use of iterative reconstruction technique. COMPARISON:  12/07/2014 FINDINGS: Brain: No evidence of acute infarction, hemorrhage, hydrocephalus, extra-axial collection or mass lesion/mass effect. Vascular: No hyperdense vessel or unexpected calcification. Skull: Normal. Negative for fracture or focal lesion. Sinuses/Orbits: Orbits are normal. Mucosal membrane  thickening opacification over the right frontal, ethmoid and right maxillary sinuses with suggestion of mucous retention cyst over the right frontal sinus. Mastoid air cells are clear. Other: None IMPRESSION: 1. No acute intracranial findings. 2. Chronic inflammatory change of the right frontal, ethmoid and right maxillary sinuses. Electronically Signed   By: Toribio Agreste M.D.   On: 08/16/2024 14:22     Procedures   Medications Ordered in the ED  sodium chloride  0.9 % bolus 1,000 mL (has no administration in time range)  morphine  (PF) 4 MG/ML injection 4 mg (has no administration in time range)    Clinical Course as of 08/16/24 1736  Tue Aug 16, 2024  1615 I, Ozell Marine DO, am transitioning care of this patient to the oncoming provider pending MRI brain CT abdomen pelvis reevaluation and disposition [MP]    Clinical Course  User Index [MP] Pamella Ozell LABOR, DO                                 Medical Decision Making 58 year old female with history as above presenting for multiple complaints that started yesterday.  Last known well time 1400 yesterday.  Acute onset headache and reported left arm weakness.  She does have some left arm weakness on my exam no other deficits.  CT head Noncon unremarkable.  Also reporting abdominal pain nausea vomiting with some blood appreciated in the emesis.  Hemodynamically stable.  Left lower quadrant tenderness.  Differential diagnosis includes stroke/TIA, viral GI illness, intra-abdominal infection such as gastroenteritis diverticulitis, GI bleeding including peptic ulcer Mallory-Weiss tear.  With history of sickle cell and reported new onset neurologic deficits will need to obtain MRI of the brain.  She is well outside of the window for any thrombo-lytics at this time.  Will obtain CT abdomen pelvis to evaluate for intra-abdominal infection.  IV fluids and morphine  for pain control  Amount and/or Complexity of Data Reviewed Labs: ordered. Radiology:  ordered.  Risk Prescription drug management.        Final diagnoses:  Acute nonintractable headache, unspecified headache type  Left arm weakness  Abdominal pain, unspecified abdominal location    ED Discharge Orders     None          Pamella Ozell LABOR, DO 08/16/24 1736

## 2024-08-16 NOTE — ED Provider Triage Note (Signed)
 Emergency Medicine Provider Triage Evaluation Note  Marie Parsons , a 58 y.o. female  was evaluated in triage.  Pt complains of headache and sickle cell pain as well as chest pain and shortness of breath. Patient also states she has had L arm weakness that started yesterday around 1400. Patient states she has had some vomiting as well and states that there has been episodes of bright red blood in her vomit. Patient also states she has been experiencing fevers at home.   Review of Systems  Positive: Headache, L arm weakness, nausea, vomiting, chest pain, shortness of breath, fever Negative: Vision changes, facial droop, slurred speech, gait issues  Physical Exam  BP 138/77 (BP Location: Right Arm)   Pulse (!) 114   Temp 98.7 F (37.1 C) (Oral)   Resp 18   Ht 5' 4 (1.626 m)   Wt 81.6 kg   LMP 08/06/2016   SpO2 97%   BMI 30.90 kg/m  Gen:   Awake, no distress   Resp:  Normal effort, talking in full sentences on room air MSK:   Moves extremities without difficulty, no appreciated strength deficit of L arm at time of exam in triage, no strength deficit of lower extremities Other:  No facial droop, slurred speech  Medical Decision Making  Medically screening exam initiated at 1:28 PM.  Appropriate orders placed.  Kaira K Gwynne was informed that the remainder of the evaluation will be completed by another provider, this initial triage assessment does not replace that evaluation, and the importance of remaining in the ED until their evaluation is complete.  Orders: CBC, CMP, troponin, CBG, Covid/flu/RSV, UA, CT head, CXR   Janetta Terrall FALCON, PA-C 08/16/24 1337

## 2024-08-16 NOTE — ED Notes (Signed)
 Pt sleeping comfortably.

## 2024-08-16 NOTE — ED Notes (Signed)
 Trop added to previous blood drawn

## 2024-08-17 ENCOUNTER — Other Ambulatory Visit (HOSPITAL_COMMUNITY): Payer: Self-pay

## 2024-08-17 ENCOUNTER — Encounter (HOSPITAL_COMMUNITY): Payer: Self-pay

## 2024-08-17 DIAGNOSIS — R509 Fever, unspecified: Secondary | ICD-10-CM | POA: Diagnosis not present

## 2024-08-17 DIAGNOSIS — J029 Acute pharyngitis, unspecified: Secondary | ICD-10-CM | POA: Diagnosis not present

## 2024-08-17 DIAGNOSIS — R569 Unspecified convulsions: Secondary | ICD-10-CM | POA: Diagnosis not present

## 2024-08-17 DIAGNOSIS — R519 Headache, unspecified: Secondary | ICD-10-CM

## 2024-08-17 DIAGNOSIS — Z789 Other specified health status: Secondary | ICD-10-CM

## 2024-08-17 DIAGNOSIS — J019 Acute sinusitis, unspecified: Secondary | ICD-10-CM

## 2024-08-17 LAB — RESPIRATORY PANEL BY PCR

## 2024-08-17 LAB — COMPREHENSIVE METABOLIC PANEL WITH GFR
ALT: 24 U/L (ref 0–44)
AST: 22 U/L (ref 15–41)
Albumin: 3.1 g/dL — ABNORMAL LOW (ref 3.5–5.0)
Alkaline Phosphatase: 87 U/L (ref 38–126)
Anion gap: 13 (ref 5–15)
BUN: 7 mg/dL (ref 6–20)
CO2: 20 mmol/L — ABNORMAL LOW (ref 22–32)
Calcium: 8.1 mg/dL — ABNORMAL LOW (ref 8.9–10.3)
Chloride: 104 mmol/L (ref 98–111)
Creatinine, Ser: 0.72 mg/dL (ref 0.44–1.00)
GFR, Estimated: >60 mL/min (ref >60)
Glucose, Bld: 110 mg/dL — ABNORMAL HIGH (ref 70–99)
Potassium: 3.7 mmol/L (ref 3.5–5.1)
Sodium: 137 mmol/L (ref 135–145)
Total Bilirubin: 1.2 mg/dL (ref 0.0–1.2)
Total Protein: 7.5 g/dL (ref 6.5–8.1)

## 2024-08-17 LAB — CBC
HCT: 40.5 % (ref 36.0–46.0)
Hemoglobin: 13.9 g/dL (ref 12.0–15.0)
MCH: 32.1 pg (ref 26.0–34.0)
MCHC: 34.3 g/dL (ref 30.0–36.0)
MCV: 93.5 fL (ref 80.0–100.0)
Platelets: 229 K/uL (ref 150–400)
RBC: 4.33 MIL/uL (ref 3.87–5.11)
RDW: 13 % (ref 11.5–15.5)
WBC: 15.3 K/uL — ABNORMAL HIGH (ref 4.0–10.5)
nRBC: 0 % (ref 0.0–0.2)

## 2024-08-17 LAB — HIV ANTIBODY (ROUTINE TESTING W REFLEX): HIV Screen 4th Generation wRfx: NONREACTIVE

## 2024-08-17 LAB — T4, FREE: Free T4: 0.75 ng/dL (ref 0.61–1.12)

## 2024-08-17 LAB — TSH: TSH: 1.372 u[IU]/mL (ref 0.350–4.500)

## 2024-08-17 MED ORDER — DIPHENHYDRAMINE HCL 50 MG/ML IJ SOLN
12.5000 mg | Freq: Once | INTRAMUSCULAR | Status: AC
  Administered 2024-08-17: 12.5 mg via INTRAVENOUS
  Filled 2024-08-17: qty 1

## 2024-08-17 MED ORDER — PROCHLORPERAZINE EDISYLATE 10 MG/2ML IJ SOLN
10.0000 mg | Freq: Once | INTRAMUSCULAR | Status: AC
  Administered 2024-08-17: 10 mg via INTRAVENOUS
  Filled 2024-08-17: qty 2

## 2024-08-17 MED ORDER — ONDANSETRON 4 MG PO TBDP
4.0000 mg | ORAL_TABLET | Freq: Three times a day (TID) | ORAL | Status: DC | PRN
  Administered 2024-08-17: 4 mg via ORAL
  Filled 2024-08-17: qty 1

## 2024-08-17 MED ORDER — AMOXICILLIN-POT CLAVULANATE 875-125 MG PO TABS
1.0000 | ORAL_TABLET | Freq: Two times a day (BID) | ORAL | 0 refills | Status: AC
  Filled 2024-08-17: qty 11, 6d supply, fill #0

## 2024-08-17 MED ORDER — NAPROXEN 250 MG PO TABS
500.0000 mg | ORAL_TABLET | Freq: Two times a day (BID) | ORAL | Status: DC | PRN
Start: 2024-08-17 — End: 2024-08-17
  Administered 2024-08-17: 500 mg via ORAL
  Filled 2024-08-17 (×2): qty 2

## 2024-08-17 MED ORDER — ENOXAPARIN SODIUM 40 MG/0.4ML IJ SOSY
40.0000 mg | PREFILLED_SYRINGE | Freq: Every day | INTRAMUSCULAR | Status: DC
  Filled 2024-08-17: qty 0.4

## 2024-08-17 MED ORDER — KETOROLAC TROMETHAMINE 15 MG/ML IJ SOLN
15.0000 mg | Freq: Once | INTRAMUSCULAR | Status: AC
  Administered 2024-08-17: 15 mg via INTRAVENOUS
  Filled 2024-08-17: qty 1

## 2024-08-17 MED ORDER — ACETAMINOPHEN 325 MG PO TABS: 650.0000 mg | ORAL_TABLET | Freq: Four times a day (QID) | ORAL | Status: DC | PRN

## 2024-08-17 MED ORDER — ASPIRIN 81 MG PO TBEC
81.0000 mg | DELAYED_RELEASE_TABLET | Freq: Every day | ORAL | 0 refills | Status: AC
  Filled 2024-08-17: qty 30, 30d supply, fill #0

## 2024-08-17 MED ORDER — AMOXICILLIN-POT CLAVULANATE 875-125 MG PO TABS
1.0000 | ORAL_TABLET | Freq: Two times a day (BID) | ORAL | Status: DC
  Administered 2024-08-17: 1 via ORAL
  Filled 2024-08-17: qty 1

## 2024-08-17 NOTE — Discharge Instructions (Addendum)
 Dear Annikah K Cerrone,  Thank you for letting us  participate in your care. You were hospitalized for migraine and fever with sore throat and diagnosed with sinusitis. You were treated with medications to help your migraine. We are planning to start you on an antibiotic for your sinus infection.    MEDICATION CHANGES - Take pain medications as needed for headache - Start an antibiotic, Augmentin, to take one pill in the morning and one in the afternoon through Monday 08/22/2024. - Start taking a baby aspirin (81 mg) daily  POST-HOSPITAL & CARE INSTRUCTIONS Follow up with your primary care physician next week, as scheduled below.  DOCTOR'S APPOINTMENT   Future Appointments  Date Time Provider Department Center  08/22/2024  9:10 AM Everhart, Elyce, DO [facility]    Take care and be well!  Family Medicine Teaching Service Inpatient Team [place]  [facility]  [address] [place], [place] [address] [phone]

## 2024-08-17 NOTE — Assessment & Plan Note (Addendum)
 GERD-Not on medication Seizure- not on medication Hx of Graves disease- TSH/T4 check

## 2024-08-17 NOTE — Assessment & Plan Note (Addendum)
-   Admit to FMTS for observation, attending Dr. Delores - Antibiotics: Received Rocephin 2g IV and Fleeta in ED - Will not redose abx at this time - AM Labs: CBC, BMP - Fall precautions

## 2024-08-17 NOTE — Assessment & Plan Note (Signed)
 COVID and Flu negative, RPP negative. Afebrile so far today. White count largely unchanged (15.3 today 11/12 from 15.0 yesterday 11/11). CT head with evidence of sinusitis. - Abx: Will start on Augmentin 875/125 BID to complete 7 day total course (11/12-11/17) for sinusitis - S/p Rocephin 2g IV and Azithro 500 mg IV (11/11) and Vanc (11/12) in ED - AM Labs: Lab holiday - Fall precautions - PT eval without acute PT needs, no recommendations

## 2024-08-17 NOTE — H&P (Addendum)
 Hospital Admission History and Physical Service Pager: [phone]  Patient name: Marie Parsons Medical record number: [id number] Date of Birth: 1966-01-07 Age: 58 y.o. Gender: female  Primary Care Provider: None Consultants: None Code Status: Full Code which was confirmed with family if patient unable to confirm   Preferred Emergency Contact:  Marie Parsons (daughter) [phone]  Marie Parsons (daughter) [phone]  Chief Complaint:  fever, HA, SOB, and abdominal pain  Differential and Medical Decision Making:  Marie Parsons is a 58 y.o. female with PMH of seizure ( not on medication), GERD, Sickle cell trait, Graves disease, and hx of FUO presenting with severe HA, fever, SOB and abdominal pain with infection like symptom.   Differential for this patient's presentation of this includes   Fever w/ headache: Present with low grade fever Tm 38c with WBC of 15. All imaging are negative. Neurologic deficits were transient. Pending HIV screen. Low concern for encephalitis/meningitis.  Migraine : Pt w/ severe HA and very sensitive to light on exam. Denies vision change. Negative brain MRI and head CT.    Stroke/ Seizure : Hx of seizure , not currently on medication. No significant abnormality finding on head CT and brain MRI. Neurology consulted in ED and sign off  UTI unlikely while pt w/ elevated WBC and low grade fever w/ UA + for Leukocytosis there is no Nitrite in urine  HIV not at risk for HIV per patient. Denies multiple sexual partner. Denies IV drug use. HIV lab screen pending result  Common cold could be possible considering all non-specific symptoms. COVID and Flu is negative. Pending respiratory panel result  Assessment & Plan Fever with sore throat - Admit to FMTS for observation, attending Dr. Delores - Antibiotics: Received Rocephin 2g IV and Fleeta in ED - Will not redose abx at this time - AM Labs: CBC, BMP - Fall precautions Headache - Neurology  consulted and sign off -reports GI intolerance with tylenol  -Trial IV Toradol, Benadryl , and Compazine -if she were to develop neurologic deficits, or AMS consider LP Chronic health problem GERD-Not on medication Seizure- not on medication Hx of Graves disease- TSH/T4 check  FEN/GI: Regular diet VTE Prophylaxis: Lovenox   Disposition: Med-surg  History of Present Illness:   Marie Parsons is a 58 y.o. female with history of seizure ( not on medication), GERD, Sickle cell trait, Graves disease, and hx of FUO presenting with fever, SOB and abdominal pain with infection like symptom starting yesterday afternoon w/ ongoing severe headache, but no aura/vision changes.. She initially did not seek care until developed left upper extremity weakness, N/V w/blood. Per patient she has not been taking any medications at home.   In the ED, her hemodynamically stable. Pertinent labs included  WBC 15 with Tm of 100.60F . All imaging is negative. Neurology Dr. Vanessa was consult and sign out in the ED for c/o Stroke/Seizure, however w/ negative work up - stroke and seizure are unlikely.   With her elevate WBC and low grade fever. Pt is admitted for observation for FUO  Review Of Systems: Per HPI   Pertinent Past Medical History: Seizure disorder Sickle cell trait  Graves disease GERD FUO   Remainder reviewed in history tab.   Pertinent Past Surgical History: None Remainder reviewed in history tab.   Pertinent Social History: Tobacco use: No Alcohol use: Denies Other Substance use: Denies Lives with Self  Pertinent Family History: None contributing   Important Outpatient Medications: None  Objective: BP ROLLEN)  174/98   Pulse 84   Temp 98.9 F (37.2 C) (Oral)   Resp 19   Ht 5' 4 (1.626 m)   Wt 81.6 kg   LMP 08/06/2016   SpO2 97%   BMI 30.90 kg/m  Exam: Physical Exam Cardiovascular:     Rate and Rhythm: Normal rate.     Heart sounds: Normal heart sounds.  Pulmonary:      Effort: Pulmonary effort is normal.  Abdominal:     Palpations: Abdomen is soft.  Neurological:     Mental Status: She is alert and oriented to person, place, and time.     Cranial Nerves: Cranial nerves 2-12 are intact.     Sensory: Sensation is intact. No sensory deficit.     Motor: Motor function is intact. No weakness or tremor.  Psychiatric:        Mood and Affect: Mood normal.        Speech: Speech normal.        Behavior: Behavior normal.      Labs:  CBC BMET  Recent Labs  Lab 08/17/24 0216  WBC 15.3*  HGB 13.9  HCT 40.5  PLT 229   Recent Labs  Lab 08/17/24 0216  NA 137  K 3.7  CL 104  CO2 20*  BUN 7  CREATININE 0.72  GLUCOSE 110*  CALCIUM  8.1*      Imaging Studies Performed: CTA of the Chest with contrast for PE 08/16/2024 09:47:00 PM  IMPRESSION: 1. No pulmonary embolism. 2. No acute findings.   EXAM: CT ABDOMEN AND PELVIS WITH CONTRAST IMPRESSION: 1. No acute findings in the abdomen/pelvis. 2. Mild to moderate diverticulosis throughout the colon without evidence of active inflammation. 3. Several left-sided renal cysts. 4. Stable small umbilical hernia containing only peritoneal fat. 5. Aortic atherosclerosis.   EXAM: MRI BRAIN WITHOUT CONTRAST 08/16/2024 04:51:38 PM IMPRESSION: 1. No acute intracranial abnormality. 2. Mild chronic small vessel ischemic disease.  EXAM: CHEST - 2 VIEW IMPRESSION: No active cardiopulmonary disease.  EXAM: CT HEAD WITHOUT CONTRAST IMPRESSION: 1. No acute intracranial findings. 2. Chronic inflammatory change of the right frontal, ethmoid and right maxillary sinuses   Howell Lunger, DO 08/17/2024, 6:16 AM PGY-1, [facility] Family Medicine  FPTS Intern pager: [phone], text pages welcome Secure chat group [facility] Teaching Service   Upper Level Addendum: I have seen and evaluated this patient along with Dr. Coralee and reviewed the above note, making necessary  revisions as appropriate. I agree with the medical decision making and physical exam as noted above with the additional comments:  Ultimately ED provider felt patient was better served with observation given severe HA with fever. We will monitor patient off antibiotics. Suspect migraine headache with underlying Viral URI given sore throat and cough. Strep/Viral panel negative. Anticipate discharge <2 midnights.   Lunger Howell, DO PGY-3 [facility]

## 2024-08-17 NOTE — Consult Note (Addendum)
 NEUROLOGY CONSULT NOTE   Date of service: August 17, 2024 Patient Name: Marie Parsons MRN:  [id number] DOB:  [birth year] Chief Complaint: headache, sorethroat, seizure, fever, multiple complaints Requesting Provider: Delores Suzann HERO, MD  History of Present Illness  Marie Parsons is a 58 y.o. female with hx of sickle cells trait, GERD, Graves disease who presents with about 1 day hx of fever, bifrontal headache, sore throat, L abdominal pain and L arm pain.  Reports yesterday AM, woke up with headache and sore throat. Took kids to the park at 2pm and hurts on the side of her belly and her L arm hurts.  Endorses hx of seizures andt when I ask her what her seizure is like, she tells me that she feels sick in her head and can tell it is coming and then for 30 mins both hands and feet are shaking. She is awake and aware during these. No post ictal period. She has never seen a neurologist. She is not on any meds. She has never had any eeg. She cannot tell me who told her that she has seizures. Last episode was few months ago, usually has about 1 a month.  She had MRI of the brain for reported L sided arm pain and weakness and it is negative for acute stroke. EDP worried about meningitis and requested neuro evaluation.    ROS  Comprehensive ROS performed and pertinent positives documented in HPI   Past History   Past Medical History:  Diagnosis Date   Cancer (HCC)    Seizures (HCC)    Sickle cell anemia (HCC)     History reviewed. No pertinent surgical history.  Family History: Family History  Problem Relation Age of Onset   Arthritis Father    Asthma Daughter     Social History  reports that she quit smoking about 5 years ago. Her smoking use included cigarettes. She has never used smokeless tobacco. She reports that she does not drink alcohol and does not use drugs.  Allergies  Allergen Reactions   Acetaminophen  Nausea Only and Other (See Comments)    Seizures     Aspirin Nausea Only and Other (See Comments)    Causes seizures    Fish Allergy Other (See Comments)    Flounder and crover seizures   Ibuprofen Nausea Only and Other (See Comments)    Seizures    Peanut-Containing Drug Products Nausea And Vomiting    Medications   Current Facility-Administered Medications:    enoxaparin  (LOVENOX ) injection 40 mg, 40 mg, Subcutaneous, Daily, Suknaim, Kulkaew B, DO   morphine  (PF) 4 MG/ML injection 4 mg, 4 mg, Intravenous, Once, Penna, Michael A, DO   vancomycin  (VANCOREADY) IVPB 1750 mg/350 mL, 1,750 mg, Intravenous, Once, Mattie Marvetta SQUIBB, RPH, Last Rate: 175 mL/hr at 08/17/24 0035, 1,750 mg at 08/17/24 0035  Current Outpatient Medications:    benzonatate  (TESSALON ) 100 MG capsule, Take 1 capsule (100 mg total) by mouth 2 (two) times daily as needed for cough., Disp: 20 capsule, Rfl: 0   Calcium  Citrate 250 MG TABS, Take 2 tablets (500 mg total) by mouth daily., Disp: 60 tablet, Rfl: 0   cetirizine  (ZYRTEC ) 10 MG tablet, Take 1 tablet (10 mg total) by mouth daily., Disp: 90 tablet, Rfl: 3   famotidine  (PEPCID ) 10 MG tablet, Take 1 tablet (10 mg total) by mouth daily., Disp: 30 tablet, Rfl: 3   fluticasone  (FLONASE ) 50 MCG/ACT nasal spray, Place 2 sprays into both nostrils daily., Disp: 16  g, Rfl: 6   naproxen  (NAPROSYN ) 500 MG tablet, Take 1 tablet (500 mg total) by mouth 2 (two) times daily., Disp: 30 tablet, Rfl: 0   oseltamivir  (TAMIFLU ) 75 MG capsule, Take 1 capsule (75 mg total) by mouth every 12 (twelve) hours., Disp: 10 capsule, Rfl: 0   polyethylene glycol powder (GLYCOLAX /MIRALAX ) 17 GM/SCOOP powder, Take 17 g by mouth 2 (two) times daily as needed., Disp: 3350 g, Rfl: 1   polyethylene glycol powder (GLYCOLAX /MIRALAX ) 17 GM/SCOOP powder, Take 17 g by mouth 2 (two) times daily as needed., Disp: 3350 g, Rfl: 1   propranolol  (INDERAL ) 60 MG tablet, Take 2 tablets (120 mg total) by mouth daily., Disp: 60 tablet, Rfl: 0   Vitamin D , Cholecalciferol ,  400 units TABS, Take 400 mg by mouth 2 (two) times daily., Disp: 150 tablet, Rfl: 0  Vitals   Vitals:   08/16/24 2101 08/16/24 2145 08/17/24 0033 08/17/24 0200  BP:  (!) 146/65 (!) 163/99 (!) 174/98  Pulse:  94 93 84  Resp:  12 19   Temp: 99.5 F (37.5 C)  98.9 F (37.2 C)   TempSrc: Oral  Oral   SpO2:  96% 98% 97%  Weight:      Height:        Body mass index is 30.9 kg/m.   Physical Exam   General: Laying comfortably in bed; in no acute distress.  HENT: Normal oropharynx and mucosa. Normal external appearance of ears and nose.  Neck: Supple, no pain or tenderness  CV: No JVD. No peripheral edema.  Pulmonary: Symmetric Chest rise. Normal respiratory effort.  Abdomen: Soft to touch, non-tender.  Ext: No cyanosis, edema, or deformity  Skin: No rash. Normal palpation of skin.   Musculoskeletal: Normal digits and nails by inspection. No clubbing.   Neurologic Examination  Mental status/Cognition: Alert, oriented to self, place, month and year, good attention.  Speech/language: Fluent, comprehension intact, object naming intact, repetition intact.  Cranial nerves:   CN II Pupils equal and reactive to light, no VF deficits    CN III,IV,VI EOM intact, no gaze preference or deviation, no nystagmus    CN V normal sensation in V1, V2, and V3 segments bilaterally    CN VII no asymmetry, no nasolabial fold flattening    CN VIII normal hearing to speech    CN IX & X normal palatal elevation, no uvular deviation    CN XI 5/5 head turn and 5/5 shoulder shrug bilaterally    CN XII midline tongue protrusion    Motor:  Muscle bulk: normal, tone normal, pronator drift none. tremor none Mvmt Root Nerve  Muscle Right Left Comments  SA C5/6 Ax Deltoid 5 5   EF C5/6 Mc Biceps 5 5   EE C6/7/8 Rad Triceps 5 5   WF C6/7 Med FCR     WE C7/8 PIN ECU     F Ab C8/T1 U ADM/FDI 5 5   HF L1/2/3 Fem Illopsoas 5 5   KE L2/3/4 Fem Quad 5 5   DF L4/5 D Peron Tib Ant 5 5   PF S1/2 Tibial Grc/Sol  5 5    Sensation:  Light touch Intact throughout   Pin prick    Temperature    Vibration   Proprioception    Coordination/Complex Motor:  - Finger to Nose intact BL - Heel to shin intact BL - Rapid alternating movement are normal - Gait: Stride length normal. Arm swing poor. Base width narrow.  Labs/Imaging/Neurodiagnostic  studies   CBC:  Recent Labs  Lab 2024/09/14 1229  WBC 15.0*  HGB 14.6  HCT 41.8  MCV 92.5  PLT 269   Basic Metabolic Panel:  Lab Results  Component Value Date   NA 134 (L) 09/14/2024   K 3.6 Sep 14, 2024   CO2 24 Sep 14, 2024   GLUCOSE 108 (H) September 14, 2024   BUN 9 2024-09-14   CREATININE 0.75 09/14/2024   CALCIUM  8.6 (L) September 14, 2024   GFRNONAA >60 Sep 14, 2024   GFRAA 113 11/15/2019   Lipid Panel: No results found for: LDLCALC HgbA1c:  Lab Results  Component Value Date   HGBA1C 5.6 11/15/2019   Urine Drug Screen:     Component Value Date/Time   LABOPIA NONE DETECTED 05/30/2016 0717   COCAINSCRNUR NONE DETECTED 05/30/2016 0717   LABBENZ NONE DETECTED 05/30/2016 0717   AMPHETMU NONE DETECTED 05/30/2016 0717   THCU NONE DETECTED 05/30/2016 0717   LABBARB NONE DETECTED 05/30/2016 0717    Alcohol Level     Component Value Date/Time   ETH <11 08/29/2011 1930   INR  Lab Results  Component Value Date   INR 1.00 05/09/2012   APTT No results found for: APTT AED levels:  Lab Results  Component Value Date   PHENYTOIN  0.3 (L) 08/30/2010    CT Head without contrast(Personally reviewed): CTH was negative for a large hypodensity concerning for a large territory infarct or hyperdensity concerning for an ICH  MRI Brain(Personally reviewed): No acute abnormality  ASSESSMENT   Marie Parsons is a 58 y.o. female with hx of sickle cells trait, GERD, Graves disease who presents with about 1 day hx of fever, bifrontal headache, sore throat, L abdominal pain and L arm pain and weakness.  Neurology consulted due to worry about seizure,  meningitis, stroke, L arm weakness.  Neuro exam with no focal deficit. My suspicion for meningitis is low as she is alert and oriented x 4, can do simple calculations with presevered executive function, she does not have nuchal rigidity, negative kernigs and brudzinskis sign.   Low suspicion for stroke as her MRI brain is negative for stroke, her left arm does not have any objective weakness or incoordination on my exam and it is also unlikely for stroke to cause pain.   She describes her seizure like episodes as feel it coming on in my head --> both arms and leg shake for 30 mins during which she is awake and aware --> then it finally stops with no post ictal period. I would not expect her to have any degree of wakefulness or awareness during a seizure which involves BL hemispheres and would expect her to be post ictal for several hours, specially after a 30 mins seizure. I have very low clinical suspicion that she has epileptic seizures. In addition, her last seizure like episode was few months ago.  RECOMMENDATIONS  - i dont think the patient needs to be admitted from a neuro standpoint. Not much inpatient neurologic workup to order. Neurology will signoff. Can get migraine cocktail in the ED but would not recommend admission for migraine. - follow up outpatient for routine EEG and neurology evaluation. Descriptions of the episodes of seizures like activity provided by the patient are less concerning for epileptic seizures. - neurology will signoff. Please feel free to contact us  with any questions or concerns. ______________________________________________________________________  Plan was discussed with Dr. Dreama in the ED. Plan also relayed to [facility] medicine residents oncall overnight over secure chat.  I personally spent a  total of 75 minutes in the care of the patient today including preparing to see the patient, getting/reviewing separately obtained history, performing a medically  appropriate exam/evaluation, counseling and educating, placing orders, referring and communicating with other health care professionals, documenting clinical information in the EHR, independently interpreting results, and communicating results.   Signed, Janeann Paisley, MD [facility]

## 2024-08-17 NOTE — Hospital Course (Addendum)
 Marie Parsons is a 58 y.o.female with a history of  seizure ( not on medication), GERD, Sickle cell trait, Graves disease, and hx of fever of unknown origin  who was admitted to the [facility] Teaching Service at [facility] for Fever and headache. Her hospital course is detailed below:  Fever Sinusitis Had one day of fever, SOB and abdominal pain on admission. She initially did not seek care until developed left upper extremity weakness, N/V w/blood per patient report. Per patient, she has not been taking any medications at home. Had low grade fever to 38C and WBC of 15, largely unchanged on repeat. CTPE negative with no acute findings. CTAP without acute findings, did show mild/mod diverticulosis. MRI brain without acute intracranial abnormality. CT head without acute findings, did show chronic right sinusitis (frontal, ethmoid, right maxillary). Blood cultures with no growth at time of discharge. Suspect fever potentially related to acute sinusitis, treating with Augmentin to complete 7 day course (11/12-11/17; given IV abx in ED on 11/11).  Headache  No hx of migraine. Patient reported ongoing severe headache, but no aura/vision changes. Did have left upper extremity weakness prior to presentation; stroke workup negative as above. Neurology was consulted, who were not concerned for stroke, seizures, or meningitis. Received migraine cocktail with Toradol, Benadryl , and Compazine, which improved her HA. Normal PT eval on 11/12. Suspect 2/2 to sinusitis, treatment as above. However, given concern for potential TIA, will start patient on ASA 81 mg daily.  Other chronic conditions were medically managed with home medications and formulary alternatives as necessary (GERD, nonepileptiform seizure, hx Graves disease)  PCP Follow-up Recommendations: Note patient has not seen PCP since 2021, likely needs physical to follow-up health maintenance Referral to neurology for routine EEG and neuro evaluation given hx  nonepileptiform seizure Consider hypertension combo agent (elevated BP in hospital) Check LDL and consider statin if indicated Clarify allergies as able (pt tolerated Toradol in hospital)

## 2024-08-17 NOTE — TOC Initial Note (Signed)
 Transition of Care [facility]) - Initial/Assessment Note    Patient Details  Name: Marie Parsons MRN: [id number] Date of Birth: 06/13/1966  Transition of Care [facility]) CM/SW Contact:    Lauraine FORBES Saa, LCSWA Phone Number: 08/17/2024, 10:22 AM  Clinical Narrative:                  10:22 AM Per chart review, patient resides at home with child(ren) and other relatives. Patient has a PCP and insurance. Patient does not have SNF/HH/DME history. Patient's preferred pharmacy's are [facility] [address], [facility] [address] [place], [facility] [address], and [facility]. No TOC needs identified at this time. TOC will continue to follow.  Expected Discharge Plan: Home/Self Care Barriers to Discharge: Continued Medical Work up   Patient Goals and CMS Choice            Expected Discharge Plan and Services   Discharge Planning Services: CM Consult   Living arrangements for the past 2 months: Single Family Home Expected Discharge Date: 08/17/24                                    Prior Living Arrangements/Services Living arrangements for the past 2 months: Single Family Home Lives with:: Adult Children, Relatives Patient language and need for interpreter reviewed:: Yes        Need for Family Participation in Patient Care: No (Comment)     Criminal Activity/Legal Involvement Pertinent to Current Situation/Hospitalization: No - Comment as needed  Activities of Daily Living      Permission Sought/Granted Permission sought to share information with : Family Supports Permission granted to share information with : No (Contact information on chart)  Share Information with NAME: Cathlean Larve     Permission granted to share info w Relationship: Sister  Permission granted to share info w Contact Information: [phone]  Emotional Assessment       Orientation: : Oriented to Self, Oriented to Place, Oriented to  Time, Oriented to Situation Alcohol /  Substance Use: Not Applicable Psych Involvement: No (comment)  Admission diagnosis:  Fever of unknown origin (FUO) [R50.9] Left arm weakness [R29.898] Abdominal pain, unspecified abdominal location [R10.9] Acute nonintractable headache, unspecified headache type [R51.9] Patient Active Problem List   Diagnosis Date Noted   Chronic health problem 08/17/2024   Headache 08/17/2024   Sinusitis, acute 08/17/2024   Abscess 07/23/2020   Well woman exam with routine gynecological exam 06/08/2020   No-show for appointment 06/08/2020   Routine adult health maintenance 11/19/2019   Pain in a tooth or teeth 11/19/2019   Need for immunization against influenza 11/19/2019   Anal fissure 11/19/2019   Gastroesophageal reflux disease with esophagitis without hemorrhage 11/19/2019   Nodule of anterior chest wall 11/19/2019   Asthma due to seasonal allergies 04/20/2019   Graves disease 01/15/2018   Sickle cell trait 01/05/2018   Acute facial pain 05/30/2016   Right-sided chest pain 05/30/2016   Hypomagnesemia 05/30/2016   Fever with sore throat 05/29/2016   Seizure disorder (HCC) 05/29/2016   Tachycardia 05/29/2016   Hypokalemia 05/29/2016   THYROID  STIMULATING HORMONE, ABNORMAL 10/10/2009   Constipation 10/10/2009   GALACTORRHEA 10/10/2009   PCP:  Stoney Blizzard, DO Pharmacy:   [facility] [phone] - [place], [place] - [address] AT [facility] [address] [place] [address] Phone: [phone] Fax: [phone]  [id number] [place], [place] - [address] AT [address] [place] [address] Phone: [phone] Fax: [phone]  [facility] [address]  - [address] [place] [place] [address] Phone: [phone] Fax: [phone]  [facility] [address] [place] [address] Phone: [phone] Fax: [phone]     Social  Drivers of Health (SDOH) Social History: SDOH Screenings   Depression (PHQ2-9): Low Risk  (07/23/2020)  Tobacco Use: Medium Risk (08/17/2024)   SDOH Interventions:     Readmission Risk Interventions     No data to display

## 2024-08-17 NOTE — Assessment & Plan Note (Signed)
 GERD - Not on medication Seizure disorder - Not on medication Hx of Graves disease - Normal TSH/T4; not on medication

## 2024-08-17 NOTE — TOC Transition Note (Signed)
 Transition of Care [facility]) - Discharge Note   Patient Details  Name: Marie Parsons MRN: [id number] Date of Birth: [birth year]  Transition of Care [facility]) CM/SW Contact:  Lauraine FORBES Saa, LCSWA Phone Number: 08/17/2024, 11:35 AM   Clinical Narrative:     Patient will DC to: Home Anticipated DC date: 08/17/2024 Family notified:  Transport by: Family   Per MD patient ready for DC to home. RN, patient, patient's family, and facility notified of DC. Family to transport patient home. TOC will sign off for now as intervention is no longer needed. Please consult us  again if new needs arise.    Final next level of care: Home/Self Care Barriers to Discharge: Barriers Resolved   Patient Goals and CMS Choice Patient states their goals for this hospitalization and ongoing recovery are:: to return home          Discharge Placement                    Patient and family notified of of transfer: 08/17/24  Discharge Plan and Services Additional resources added to the After Visit Summary for     Discharge Planning Services: CM Consult                                 Social Drivers of Health (SDOH) Interventions SDOH Screenings   Food Insecurity: Food Insecurity Present (08/17/2024)  Housing: High Risk (08/17/2024)  Transportation Needs: Unmet Transportation Needs (08/17/2024)  Utilities: Not At Risk (08/17/2024)  Depression (PHQ2-9): Low Risk  (07/23/2020)  Tobacco Use: Medium Risk (08/17/2024)     Readmission Risk Interventions     No data to display

## 2024-08-17 NOTE — Assessment & Plan Note (Addendum)
-   Neurology consulted and sign off -reports GI intolerance with tylenol  -Trial IV Toradol, Benadryl , and Compazine -if she were to develop neurologic deficits, or AMS consider LP

## 2024-08-17 NOTE — Evaluation (Signed)
 Physical Therapy Evaluation Patient Details Name: Marie Parsons MRN: [id number] DOB: 03-Apr-1966 Today's Date: 08/17/2024  History of Present Illness  58 y.o. female presents to [facility] hospital on 08/16/2024 with HA, fever, SOB, abdominal pain. PMH includes seizure, sickle cell trait, graves disease, GERD.  Clinical Impression  Pt presents to PT without significant deficits at this time, mobilizing independently. Pt expresses no significant concerns regarding mobility. Pt has no further acute PT needs at this time. PT signing off.        If plan is discharge home, recommend the following: Assist for transportation   Can travel by private vehicle        Equipment Recommendations None recommended by PT  Recommendations for Other Services       Functional Status Assessment Patient has not had a recent decline in their functional status     Precautions / Restrictions Precautions Precautions: None Restrictions Weight Bearing Restrictions Per Provider Order: No      Mobility  Bed Mobility Overal bed mobility: Independent                  Transfers Overall transfer level: Independent Equipment used: None                    Ambulation/Gait Ambulation/Gait assistance: Independent Gait Distance (Feet): 300 Feet Assistive device: None Gait Pattern/deviations: WFL(Within Functional Limits) Gait velocity: functional Gait velocity interpretation: >2.62 ft/sec, indicative of community ambulatory   General Gait Details: steady step-through gait  Stairs            Wheelchair Mobility     Tilt Bed    Modified Rankin (Stroke Patients Only)       Balance Overall balance assessment: Independent                                           Pertinent Vitals/Pain Pain Assessment Pain Assessment: 0-10 Pain Score: 8  Pain Location: head Pain Descriptors / Indicators: Headache Pain Intervention(s): Monitored during session    Home  Living Family/patient expects to be discharged to:: Private residence Living Arrangements: Children;Other relatives Available Help at Discharge: Family;Available PRN/intermittently Type of Home: House Home Access: Level entry       Home Layout: One level Home Equipment: None      Prior Function Prior Level of Function : Independent/Modified Independent             Mobility Comments: ambulatory without DME ADLs Comments: daughter assists with transportation     Extremity/Trunk Assessment   Upper Extremity Assessment Upper Extremity Assessment: Overall WFL for tasks assessed    Lower Extremity Assessment Lower Extremity Assessment: Overall WFL for tasks assessed    Cervical / Trunk Assessment Cervical / Trunk Assessment: Normal  Communication   Communication Communication: No apparent difficulties    Cognition Arousal: Alert Behavior During Therapy: WFL for tasks assessed/performed   PT - Cognitive impairments: No apparent impairments                         Following commands: Intact       Cueing Cueing Techniques: Verbal cues     General Comments General comments (skin integrity, edema, etc.): VSS on RA    Exercises     Assessment/Plan    PT Assessment Patient does not need any further PT services  PT Problem List  PT Treatment Interventions      PT Goals (Current goals can be found in the Care Plan section)       Frequency       Co-evaluation               AM-PAC PT 6 Clicks Mobility  Outcome Measure Help needed turning from your back to your side while in a flat bed without using bedrails?: None Help needed moving from lying on your back to sitting on the side of a flat bed without using bedrails?: None Help needed moving to and from a bed to a chair (including a wheelchair)?: None Help needed standing up from a chair using your arms (e.g., wheelchair or bedside chair)?: None Help needed to walk in hospital  room?: None Help needed climbing 3-5 steps with a railing? : None 6 Click Score: 24    End of Session   Activity Tolerance: Patient tolerated treatment well Patient left: in bed;with call bell/phone within reach Nurse Communication: Mobility status PT Visit Diagnosis: Other (comment) (transient neuro symptoms, headache)    Time: 9162-9153 PT Time Calculation (min) (ACUTE ONLY): 9 min   Charges:   PT Evaluation $PT Eval Low Complexity: 1 Low   PT General Charges $$ ACUTE PT VISIT: 1 Visit         Bernardino JINNY Ruth, PT, DPT Acute Rehabilitation Office [phone]   Bernardino JINNY Ruth 08/17/2024, 8:50 AM

## 2024-08-17 NOTE — Plan of Care (Signed)

## 2024-08-17 NOTE — Progress Notes (Signed)
 Reviewed AVS, patient expressed understanding of medications, MD follow up reviewed.   Patient states all belongings brought to the hospital at time of admission are accounted for and packed to take home.  Picked up medications from [facility] pharmacy. Nursing staff transported patient to entrance A, where family member was waiting in vehicle to transport home.

## 2024-08-17 NOTE — Assessment & Plan Note (Deleted)
 COVID and Flu negative, RPP negative. Afebrile so far today. White count largely unchanged (15.3 today 11/12 from 15.0 yesterday 11/11). CT head with evidence of chronic sinusitis. - Abx: Will start on Augmentin 875/125 BID for 5 days (11/12-11/16) for sinusitis - S/p Rocephin 2g IV and Azithro 500 mg IV (11/11) and Vanc (11/12) in ED - AM Labs: Lab holiday - Fall precautions - PT eval

## 2024-08-17 NOTE — Plan of Care (Signed)
 FMTS Interim Progress Note  S: Patient reports she is feeling better overall from admission.  She continues to report pain in her head at her forehead between her eyes.  Denies chest pain, shortness of breath, nausea, vomiting.  Reports she did have a bowel movement without issue yesterday.  Reports she is urinating without issue, no dysuria, no increased frequency, no back pain.  O: BP (!) 174/98   Pulse 84   Temp 98.9 F (37.2 C) (Oral)   Resp 19   Ht 5' 4 (1.626 m)   Wt 81.6 kg   LMP 08/06/2016   SpO2 97%   BMI 30.90 kg/m    Physical Exam General: Patient lying back in bed, no acute distress. Cardiovascular: Tachycardic with regular rhythm, no murmurs/rubs/gallops. Respiratory: Normal work of breathing on room air. Clear to auscultation bilaterally; no wheezes, crackles. Abdomen: Bowel sounds present and normoactive bilaterally. Soft, nondistended.  Tender to minimal palpation at left middle quadrant, to left of umbilicus; no rebound, voluntary guarding. Extremities: Skin warm, dry. No bilateral lower extremity edema. Assessment & Plan Sinusitis, acute Fever with sore throat COVID and Flu negative, RPP negative. Afebrile so far today. White count largely unchanged (15.3 today 11/12 from 15.0 yesterday 11/11). CT head with evidence of sinusitis. - Abx: Will start on Augmentin 875/125 BID to complete 7 day total course (11/12-11/17) for sinusitis - S/p Rocephin 2g IV and Azithro 500 mg IV (11/11) and Vanc (11/12) in ED - AM Labs: Lab holiday - Fall precautions - PT eval without acute PT needs, no recommendations Headache Suspect 2/2 sinusitis; see above for tx recs - Neurology consulted and signed off; low concern for seizure, meningitis - Naproxen  prn for pain, Zofran  prn for nausea - s/p IV Toradol, Benadryl , and Compazine - If she were to develop neurologic deficits or AMS consider LP Chronic health problem GERD - Not on medication Seizure disorder - Not on medication Hx  of Graves disease - Normal TSH/T4; not on medication  FEN/GI: Regular diet VTE Prophylaxis: SCDs  Will plan to discharge home today.  Larraine Palma, MD 08/17/2024, 7:48 AM PGY-1, [facility] Family Medicine Service pager [phone]

## 2024-08-17 NOTE — Assessment & Plan Note (Addendum)
 Suspect 2/2 sinusitis; see above for tx recs - Neurology consulted and signed off; low concern for seizure, meningitis - Naproxen  prn for pain, Zofran  prn for nausea - s/p IV Toradol, Benadryl , and Compazine - If she were to develop neurologic deficits or AMS consider LP

## 2024-08-17 NOTE — Discharge Summary (Addendum)
 [facility] Discharge Summary  Patient name: Marie Parsons Medical record number: [id number] Date of birth: 01-17-1966 Age: 58 y.o. Gender: female Date of Admission: 08/16/2024  Date of Discharge: 08/17/24 Admitting Physician: [place] KATHEE Samuels, DO  Primary Care Provider: Stoney Blizzard, DO Consultants: Neuro  Indication for Hospitalization: fever, headache  Discharge Diagnoses/Problem List:  Principal Problem for Admission: Sinusitis Other Problems addressed during stay:  Principal Problem:   Sinusitis, acute Active Problems:   Fever with sore throat   Headache    Brief Hospital Course:  Rachal K Gulledge is a 58 y.o.female with a history of  seizure ( not on medication), GERD, Sickle cell trait, Graves disease, and hx of fever of unknown origin  who was admitted to the [facility] Teaching Service at [facility] for Fever and headache. Her hospital course is detailed below:  Fever Sinusitis Had one day of fever, SOB and abdominal pain on admission. She initially did not seek care until developed left upper extremity weakness, N/V w/blood per patient report. Per patient, she has not been taking any medications at home. Had low grade fever to 38C and WBC of 15, largely unchanged on repeat. CTPE negative with no acute findings. CTAP without acute findings, did show mild/mod diverticulosis. MRI brain without acute intracranial abnormality. CT head without acute findings, did show chronic right sinusitis (frontal, ethmoid, right maxillary). Blood cultures with no growth at time of discharge. Suspect fever potentially related to acute sinusitis, treating with Augmentin to complete 7 day course (11/12-11/17; given IV abx in ED on 11/11).  Headache  No hx of migraine. Patient reported ongoing severe headache, but no aura/vision changes. Did have left upper extremity weakness prior to presentation; stroke workup negative as above. Neurology was consulted, who were not  concerned for stroke, seizures, or meningitis. Received migraine cocktail with Toradol, Benadryl , and Compazine, which improved her HA. Normal PT eval on 11/12. Suspect 2/2 to sinusitis, treatment as above. However, given concern for potential TIA, will start patient on ASA 81 mg daily.  Other chronic conditions were medically managed with home medications and formulary alternatives as necessary (GERD, nonepileptiform seizure, hx Graves disease)  PCP Follow-up Recommendations: Note patient has not seen PCP since 2021, likely needs physical to follow-up health maintenance Referral to neurology for routine EEG and neuro evaluation given hx nonepileptiform seizure Consider hypertension combo agent (elevated BP in hospital) Check LDL and consider statin if indicated Clarify allergies as able (pt tolerated Toradol in hospital)    Results/Tests Pending at Time of Discharge:  Unresulted Labs (From admission, onward)    None       Disposition: Home  Discharge Condition: Stable  Discharge Exam:  Vitals:   08/17/24 0200 08/17/24 0756  BP: (!) 174/98 (!) 145/73  Pulse: 84 85  Resp:  18  Temp:  98.3 F (36.8 C)  SpO2: 97% 96%   See earlier Plan of Care note by Alan Flies for physical exam.  Significant Procedures: None  Significant Labs and Imaging:  Recent Labs  Lab 08/16/24 1229 08/17/24 0216  WBC 15.0* 15.3*  HGB 14.6 13.9  HCT 41.8 40.5  PLT 269 229   Recent Labs  Lab 08/16/24 1229 08/17/24 0216  NA 134* 137  K 3.6 3.7  CL 99 104  CO2 24 20*  GLUCOSE 108* 110*  BUN 9 7  CREATININE 0.75 0.72  CALCIUM  8.6* 8.1*  ALKPHOS 98 87  AST 34 22  ALT 32 24  ALBUMIN 3.4*  3.1*    CTPE 11/11: 1. No pulmonary embolism. 2. No acute findings.   CTAP 11/11: 1. No acute findings in the abdomen/pelvis. 2. Mild to moderate diverticulosis throughout the colon without evidence of active inflammation. 3. Several left-sided renal cysts. 4. Stable small umbilical hernia  containing only peritoneal fat. 5. Aortic atherosclerosis.   MRI Brain 11/11: 1. No acute intracranial abnormality. 2. Mild chronic small vessel ischemic disease.   CXR 11/11: No active cardiopulmonary disease.   CT Head 11/11: 1. No acute intracranial findings. 2. Chronic inflammatory change of the right frontal, ethmoid and right maxillary sinuses   Discharge Medications:  Allergies as of 08/17/2024       Reactions   Acetaminophen  Nausea Only, Other (See Comments)   Seizures    Aspirin Nausea Only, Other (See Comments)   Causes seizures    Fish Allergy Other (See Comments)   Flounder and crover seizures   Ibuprofen Nausea Only, Other (See Comments)   Seizures    Peanut-containing Drug Products Nausea And Vomiting        Medication List     PAUSE taking these medications    propranolol  60 MG tablet Wait to take this until your doctor or other care provider tells you to start again. Commonly known as: INDERAL  Take 2 tablets (120 mg total) by mouth daily.       TAKE these medications    amoxicillin-clavulanate 875-125 MG tablet Commonly known as: AUGMENTIN Take 1 tablet by mouth every 12 (twelve) hours.   aspirin EC 81 MG tablet Take 1 tablet (81 mg total) by mouth daily. Swallow whole.   benzonatate  100 MG capsule Commonly known as: TESSALON  Take 1 capsule (100 mg total) by mouth 2 (two) times daily as needed for cough.   Calcium  Citrate 250 MG Tabs Take 2 tablets (500 mg total) by mouth daily. What changed: how much to take   cetirizine  10 MG tablet Commonly known as: ZYRTEC  Take 1 tablet (10 mg total) by mouth daily.   famotidine  10 MG tablet Commonly known as: PEPCID  Take 1 tablet (10 mg total) by mouth daily.   fluticasone  50 MCG/ACT nasal spray Commonly known as: FLONASE  Place 2 sprays into both nostrils daily.   polyethylene glycol powder 17 GM/SCOOP powder Commonly known as: GLYCOLAX /MIRALAX  Take 17 g by mouth 2 (two) times daily as  needed. What changed:  when to take this reasons to take this        Discharge Instructions: Please refer to Patient Instructions section of EMR for full details.  Patient was counseled important signs and symptoms that should prompt return to medical care, changes in medications, dietary instructions, activity restrictions, and follow up appointments.   Follow-Up Appointments:  Future Appointments  Date Time Provider Department Center  08/22/2024  9:10 AM Everhart, Elyce, DO [facility]    Larraine Palma, MD 08/17/2024, 10:13 AM PGY-1, [facility]  I have reviewed the above note, agree with its content, and have made the appropriate changes.   Damien Pinal, DO [facility], PGY-3

## 2024-08-17 NOTE — ED Notes (Signed)
 Floor notified of pt incoming arrival

## 2024-08-21 LAB — CULTURE, BLOOD (ROUTINE X 2)
Culture: NO GROWTH
Culture: NO GROWTH

## 2024-08-21 NOTE — Progress Notes (Deleted)
    SUBJECTIVE:   CHIEF COMPLAINT / HPI:   Hospital f/u Admitted for fever of unknown origin 11/11-11/12 Treated for sinusitis. Had LUE weakness, stroke workup negative. Concern for TIA, started on 81mg  ASA  PCP Follow-up Recommendations: Note patient has not seen PCP since 2021, likely needs physical to follow-up health maintenance Referral to neurology for routine EEG and neuro evaluation given hx nonepileptiform seizure Consider hypertension combo agent (elevated BP in hospital) Check LDL and consider statin if indicated Clarify allergies as able (pt tolerated Toradol in hospital)  PERTINENT  PMH / PSH: ***  OBJECTIVE:   LMP 08/06/2016   ***  ASSESSMENT/PLAN:   Assessment & Plan      Elyce Prescott, DO [facility]

## 2024-08-22 ENCOUNTER — Ambulatory Visit: Payer: Self-pay | Admitting: Family Medicine

## 2024-08-23 ENCOUNTER — Telehealth: Payer: Self-pay | Admitting: Family Medicine

## 2024-08-23 NOTE — Telephone Encounter (Signed)
-----   Message from [facility] sent at 08/22/2024 10:32 AM EST ----- Regarding: pt removal Hi - it was in the appointment notes to remove this pt from our panel list if she no showed, she did no show today. Dr. Anders, wanted to make sure you all had given her notice when she was last hospitalized.  Thank you, Kirstie

## 2024-08-23 NOTE — Telephone Encounter (Signed)
 Patient was last seen at the [facility] clinic in 2021, > 3 years ago. She was admitted as an unassigned patient to the FMTS, and the team recommends follow-up with us  to keep her on our panel. If she does not attend her HFU, she can be taken off the panel. The patient no-showed for her appointment. We will remove her from our patient panel. She is more than welcome to reestablish care with us  in the future. Certified letter sent to her home address.

## 2024-08-23 NOTE — Telephone Encounter (Signed)
 Certified letter sent on 08/23/24

## 2024-09-14 NOTE — Telephone Encounter (Signed)
 Certified letter sent on 08/23/24 was returned due to unreachable status.  Placed in senders box for FYI and next steps. Burnard Gander
